# Patient Record
Sex: Male | Born: 1990 | Race: Black or African American | Hispanic: No | Marital: Single | Smoking: Former smoker
Health system: Southern US, Community
[De-identification: ages and names within clinical notes are randomized; demographics above are authoritative.]

## PROBLEM LIST (undated history)

## (undated) DIAGNOSIS — G4700 Insomnia, unspecified: Secondary | ICD-10-CM

## (undated) DIAGNOSIS — F209 Schizophrenia, unspecified: Secondary | ICD-10-CM

---

## 2013-11-19 ENCOUNTER — Emergency Department: Payer: Self-pay | Admitting: Emergency Medicine

## 2013-11-19 LAB — BASIC METABOLIC PANEL
Anion Gap: 6 — ABNORMAL LOW (ref 7–16)
BUN: 11 mg/dL (ref 7–18)
CALCIUM: 8.8 mg/dL (ref 8.5–10.1)
CO2: 27 mmol/L (ref 21–32)
Chloride: 105 mmol/L (ref 98–107)
Creatinine: 1.14 mg/dL (ref 0.60–1.30)
EGFR (African American): >60
EGFR (Non-African Amer.): >60
Glucose: 102 mg/dL — ABNORMAL HIGH (ref 65–99)
OSMOLALITY: 275 (ref 275–301)
Potassium: 3.3 mmol/L — ABNORMAL LOW (ref 3.5–5.1)
SODIUM: 138 mmol/L (ref 136–145)

## 2013-11-19 LAB — DRUG SCREEN, URINE
AMPHETAMINES, UR SCREEN: NEGATIVE (ref ?–1000)
BENZODIAZEPINE, UR SCRN: NEGATIVE (ref ?–200)
Barbiturates, Ur Screen: NEGATIVE (ref ?–200)
COCAINE METABOLITE, UR ~~LOC~~: NEGATIVE (ref ?–300)
Cannabinoid 50 Ng, Ur ~~LOC~~: POSITIVE (ref ?–50)
MDMA (Ecstasy)Ur Screen: NEGATIVE (ref ?–500)
Methadone, Ur Screen: NEGATIVE (ref ?–300)
Opiate, Ur Screen: NEGATIVE (ref ?–300)
Phencyclidine (PCP) Ur S: NEGATIVE (ref ?–25)
TRICYCLIC, UR SCREEN: NEGATIVE (ref ?–1000)

## 2013-11-19 LAB — URINALYSIS, COMPLETE
BACTERIA: NONE SEEN
BILIRUBIN, UR: NEGATIVE
Blood: NEGATIVE
Glucose,UR: NEGATIVE mg/dL (ref 0–75)
Ketone: NEGATIVE
LEUKOCYTE ESTERASE: NEGATIVE
Nitrite: NEGATIVE
Ph: 8 (ref 4.5–8.0)
Protein: NEGATIVE
RBC,UR: 3 /HPF (ref 0–5)
SQUAMOUS EPITHELIAL: NONE SEEN
Specific Gravity: 1.025 (ref 1.003–1.030)
WBC UR: 1 /HPF (ref 0–5)

## 2013-11-19 LAB — PRO B NATRIURETIC PEPTIDE: B-Type Natriuretic Peptide: 16 pg/mL (ref 0–125)

## 2013-11-19 LAB — CBC
HCT: 42.1 % (ref 40.0–52.0)
HGB: 14.3 g/dL (ref 13.0–18.0)
MCH: 31 pg (ref 26.0–34.0)
MCHC: 34.1 g/dL (ref 32.0–36.0)
MCV: 91 fL (ref 80–100)
Platelet: 243 10*3/uL (ref 150–440)
RBC: 4.62 10*6/uL (ref 4.40–5.90)
RDW: 13.1 % (ref 11.5–14.5)
WBC: 7.2 10*3/uL (ref 3.8–10.6)

## 2013-11-19 LAB — TROPONIN I

## 2013-11-19 LAB — MAGNESIUM: MAGNESIUM: 1.3 mg/dL — AB

## 2014-04-30 ENCOUNTER — Emergency Department: Payer: Self-pay | Admitting: Emergency Medicine

## 2014-04-30 LAB — COMPREHENSIVE METABOLIC PANEL WITH GFR
Albumin: 4 g/dL (ref 3.4–5.0)
Alkaline Phosphatase: 54 U/L
Anion Gap: 6 — ABNORMAL LOW (ref 7–16)
BUN: 11 mg/dL (ref 7–18)
Bilirubin,Total: 1.4 mg/dL — ABNORMAL HIGH (ref 0.2–1.0)
Calcium, Total: 8.8 mg/dL (ref 8.5–10.1)
Chloride: 108 mmol/L — ABNORMAL HIGH (ref 98–107)
Co2: 29 mmol/L (ref 21–32)
Creatinine: 1.15 mg/dL (ref 0.60–1.30)
EGFR (African American): >60
EGFR (Non-African Amer.): >60
Glucose: 100 mg/dL — ABNORMAL HIGH (ref 65–99)
Osmolality: 284 (ref 275–301)
Potassium: 3.6 mmol/L (ref 3.5–5.1)
SGOT(AST): 41 U/L — ABNORMAL HIGH (ref 15–37)
SGPT (ALT): 25 U/L (ref 12–78)
Sodium: 143 mmol/L (ref 136–145)
Total Protein: 7.9 g/dL (ref 6.4–8.2)

## 2014-04-30 LAB — DRUG SCREEN, URINE
AMPHETAMINES, UR SCREEN: NEGATIVE (ref ?–1000)
BENZODIAZEPINE, UR SCRN: NEGATIVE (ref ?–200)
Barbiturates, Ur Screen: NEGATIVE (ref ?–200)
CANNABINOID 50 NG, UR ~~LOC~~: POSITIVE (ref ?–50)
COCAINE METABOLITE, UR ~~LOC~~: NEGATIVE (ref ?–300)
MDMA (ECSTASY) UR SCREEN: NEGATIVE (ref ?–500)
Methadone, Ur Screen: NEGATIVE (ref ?–300)
Opiate, Ur Screen: NEGATIVE (ref ?–300)
PHENCYCLIDINE (PCP) UR S: NEGATIVE (ref ?–25)
Tricyclic, Ur Screen: NEGATIVE (ref ?–1000)

## 2014-04-30 LAB — URINALYSIS, COMPLETE
Bacteria: NONE SEEN
Bilirubin,UR: NEGATIVE
Blood: NEGATIVE
Glucose,UR: NEGATIVE mg/dL (ref 0–75)
Ketone: NEGATIVE
Leukocyte Esterase: NEGATIVE
Nitrite: NEGATIVE
Ph: 5 (ref 4.5–8.0)
Protein: 30
RBC,UR: 3 /HPF (ref 0–5)
Specific Gravity: 1.031 (ref 1.003–1.030)
Squamous Epithelial: 1
WBC UR: 2 /HPF (ref 0–5)

## 2014-04-30 LAB — CBC
HCT: 43.9 % (ref 40.0–52.0)
HGB: 14.3 g/dL (ref 13.0–18.0)
MCH: 30.3 pg (ref 26.0–34.0)
MCHC: 32.6 g/dL (ref 32.0–36.0)
MCV: 93 fL (ref 80–100)
Platelet: 243 10*3/uL (ref 150–440)
RBC: 4.73 10*6/uL (ref 4.40–5.90)
RDW: 13.5 % (ref 11.5–14.5)
WBC: 5.1 10*3/uL (ref 3.8–10.6)

## 2014-04-30 LAB — SALICYLATE LEVEL: Salicylates, Serum: <1.7 mg/dL

## 2014-04-30 LAB — ETHANOL

## 2014-04-30 LAB — ACETAMINOPHEN LEVEL: Acetaminophen: <2 ug/mL

## 2015-02-10 NOTE — Consult Note (Signed)
PATIENT NAME:  Ryan Colon, Ryan Colon MR#:  [id number] DATE OF BIRTH:  [birth year]  DATE OF CONSULTATION:  05/01/2014  CONSULTING PHYSICIAN:  Ryah Cribb B. Jennet MaduroPucilowska, MD  REFERRING PHYSICIAN: Dr. Sharyn CreamerMark Quale.   REASON FOR CONSULTATION: To evaluate an agitated patient.   IDENTIFYING DATA: [name] is a 24 year old male with no past psychiatric history.   CHIEF COMPLAINT: "I want to go home."  HISTORY OF PRESENT ILLNESS: Reportedly, the patient was arrested for disorderly behavior. It is impossible to find out what really happened. Apparently his girlfriend stole a gun belonging to his father who is a Insurance risk surveyorbondsman. The patient was arguing about this gun with the girlfriend when police were called. Somehow he was arrested and not the girl who has the gun. The family of the young man is not concerned with the gun or his safety at all. The father bailed him out of jail and immediately took involuntary commitment on him. It does not appear that following release from jail, the patient was aggressive, suicidal, homicidal, or threatening. He called his godmother and related something to her that made her feel that he needs help. He was brought to the Emergency Room and is rather unhappy about it. He denies any psychiatric problems, no depression, anxiety, no psychosis, no substance abuse issues, no symptoms suggestive of bipolar mania. He wishes no help and wishes to take no medications. He seems not to understand why his family felt that he needs to be evaluated. Per family's report, the patient can be agitated and aggressive at times. Initially, in the Emergency Room, he was agitated. Then in the morning he presented really nicely, cool and collected, pleasant, polite, and cooperative. By the time I evaluated him, he was really anxious to leave and even though he denied any problems whatsoever, he promised me that the first thing he is going to do is to beat up his father for bringing him to the hospital. He seems not to  understand that this is not appropriate behavior and when told that it would land him in jail, he seems not to be perturbed as he is well used to being in jail.   PAST PSYCHIATRIC HISTORY: None reported. No suicide attempts. No hospitalizations. No substance abuse problems.   FAMILY PSYCHIATRIC HISTORY: He told me that his grandmother has bipolar 1 disorder.   PAST MEDICAL HISTORY: None.   ALLERGIES: No known drug allergies.   MEDICATIONS ON ADMISSION: None.   SOCIAL HISTORY: He is 22. He lives with his father most of the time. The father is a Insurance risk surveyorbondsman and reportedly, rather argumentative himself. The patient had a job once for 1 month but quit. He did not like that was told what to do and was unhappy to work while he felt everybody else was "sitting on their asses". He believes that he is discriminated against because of his race and says multiple times that he is from the [place] and things are a certain way in the [place]. He wants to figure out another career path but not very specific about that. He did go to college some.   REVIEW OF SYSTEMS:  CONSTITUTIONAL: No fevers or chills. No weight changes.  EYES: No double or blurred vision.  ENT: No hearing loss.  RESPIRATORY: No shortness of breath or cough.  CARDIOVASCULAR: No chest pain or orthopnea.  GASTROINTESTINAL: No abdominal pain, nausea, vomiting, or diarrhea.  GENITOURINARY: No incontinence or frequency.  ENDOCRINE: No heat or cold intolerance.  LYMPHATIC: No anemia or easy  bruising.  INTEGUMENTARY: No acne or rash.  MUSCULOSKELETAL: No muscle or joint pain.  NEUROLOGIC: No tingling or weakness.  PSYCHIATRIC: See history of present illness for details.   PHYSICAL EXAMINATION:  VITAL SIGNS: Blood pressure 102/51, pulse 78, respirations 16, temperature 98.  GENERAL: This is a well-developed, male in no acute distress.   The rest of the physical examination is deferred to his primary attending.   LABORATORY DATA: Chemistries  are within normal limits. Blood alcohol level is zero. LFTs within normal limits, except for total bilirubin of 1.4 and AST of 41. Urine tox screen positive for cannabinoids. CBC within normal limits. Urinalysis is not suggestive of urinary tract infection. Serum acetaminophen and salicylates are low.   MENTAL STATUS EXAMINATION: The patient is alert and oriented to person, place, time, and somewhat to situation even though he does not understand why he was brought to the Emergency Room for psychiatric evaluation on commitment. He is adequately groomed, wearing hospital scrubs. He maintains good eye contact. His speech is loud at times. His mood is fine with irritable affect. Thought process is logical. Thought content: He denies suicidal or homicidal ideations. He denies delusions or paranoia. He denies auditory or visual hallucinations. His cognition seems grossly intact, but the patient is unable to participate in cognitive part of the examination. He seems of normal intelligence and fund of knowledge. His insight and judgment seem poor   DIAGNOSES:  AXIS I: Mood disorder, not otherwise specified. Rule out bipolar disorder.   AXIS II: Deferred.   AXIS III: Deferred.   AXIS IV: Employment, financial, family conflict.   AXIS Colon: Global assessment of functioning 35.   PLAN:  1. The patient is on IVC.  2. I will start him on medications Tegretol and Risperdal for mood stabilization. This is what was requested by the family. The patient feels that he needs no treatment.  3. Unfortunately he needs to stay in the Emergency Room until he stops threatening his father. He thinks that he is going home with the father but this is not what his father tells [place] so disposition may be a problem.    ____________________________ Ellin Goodie Jennet Maduro, MD jbp:lt D: 05/01/2014 17:25:38 ET T: 05/01/2014 21:24:55 ET JOB#: [id number]  cc: Staley Lunz B. Jennet Maduro, MD, <Dictator> Shari Prows  MD ELECTRONICALLY SIGNED 05/11/2014 7:18

## 2015-02-10 NOTE — Consult Note (Signed)
Brief Consult Note: Diagnosis: Mood disorder NOS.   Patient was seen by consultant.   Consult note dictated.   Recommend further assessment or treatment.   Orders entered.   Comments: [name] has no psychiatric past. He was brought to the ER for agitated, disrespectful, threatening behavior. During the interview yesterday, he threatened to "bit up his father" as soon as he is discharged. Today he is pleasant, polite and cooperative. He is cool and collected. He is not suicidal or homicidal. He accepts medications. He is slightly sedated from Risperdal.  PLAN: 1. The patient no longer meets criteria for IVC. I will terminate proceedings. Please discharge as appropriate.   2. Will continue Tegretol and decrease Risperdal to 2 mg at bedtime for mood stabilization. Rx given.  3. Will continue Trazodone as needed for sleep. Rx given.  4. He will follow up with [facility].  5. His father will pick him up at 3:00 pm.  Electronic Signatures: Kristine LineaPucilowska, Sukhraj Esquivias (MD)  (Signed 14-Jul-15 13:10)  Authored: Brief Consult Note   Last Updated: 14-Jul-15 13:10 by Kristine LineaPucilowska, Yanni Ruberg (MD)

## 2015-02-10 NOTE — Consult Note (Signed)
Brief Consult Note: Diagnosis: Mood disorder NOS.   Patient was seen by consultant.   Consult note dictated.   Recommend further assessment or treatment.   Orders entered.   Comments: [name] has no psychiatric past. He was brought to the ER for agitated, disrespectful, threatening behavior. During the interview he threatened to "bit up his father: as soon as he is discharge.  PLAN: 1. The patient is on IVC.  2. Will start Tegretol and Risperdal for mood stabilization and Trazodone for sleep.  Electronic Signatures: Kristine LineaPucilowska, Kazim Corrales (MD)  (Signed 13-Jul-15 14:08)  Authored: Brief Consult Note   Last Updated: 13-Jul-15 14:08 by Kristine LineaPucilowska, Malachy Coleman (MD)

## 2015-02-10 NOTE — Consult Note (Signed)
Brief Consult Note: Diagnosis: MOOD DISRDER NOS.   Patient was seen by consultant.   Orders entered.   Comments: Pt seen in ED BHU. He stated that he came here due to disoderly conduct and was just dischargded from jail. he stated that he wamnts to go [place], where his fa,ily lives and he grew up there. he is responding to the meds. no agitation noted. no si/hi or plans noted.  PLAN  Will change Risperdal 1mg  po bid as he is too sedated.  continue Tegretol.  willl follow.  Electronic Signatures: Rhunette CroftFaheem, Nazario Russom S (MD)  (Signed 14-Jul-15 11:17)  Authored: Brief Consult Note   Last Updated: 14-Jul-15 11:17 by Rhunette CroftFaheem, Mackayla Mullins S (MD)

## 2015-04-01 ENCOUNTER — Emergency Department
Admission: EM | Admit: 2015-04-01 | Discharge: 2015-04-02 | Disposition: A | Discharge status: Other Acute Inpatient Hospital | Payer: MEDICAID | Attending: Emergency Medicine | Admitting: Emergency Medicine

## 2015-04-01 ENCOUNTER — Encounter: Payer: Self-pay | Admitting: Emergency Medicine

## 2015-04-01 DIAGNOSIS — F1210 Cannabis abuse, uncomplicated: Secondary | ICD-10-CM | POA: Diagnosis not present

## 2015-04-01 DIAGNOSIS — F39 Unspecified mood [affective] disorder: Secondary | ICD-10-CM | POA: Insufficient documentation

## 2015-04-01 DIAGNOSIS — R4689 Other symptoms and signs involving appearance and behavior: Secondary | ICD-10-CM

## 2015-04-01 DIAGNOSIS — F250 Schizoaffective disorder, bipolar type: Secondary | ICD-10-CM | POA: Diagnosis not present

## 2015-04-01 DIAGNOSIS — F911 Conduct disorder, childhood-onset type: Secondary | ICD-10-CM | POA: Diagnosis present

## 2015-04-01 DIAGNOSIS — Z720 Tobacco use: Secondary | ICD-10-CM | POA: Diagnosis not present

## 2015-04-01 LAB — URINE DRUG SCREEN, QUALITATIVE (ARMC ONLY)
Amphetamines, Ur Screen: NOT DETECTED
BENZODIAZEPINE, UR SCRN: NOT DETECTED
Barbiturates, Ur Screen: NOT DETECTED
CANNABINOID 50 NG, UR ~~LOC~~: POSITIVE — AB
Cocaine Metabolite,Ur ~~LOC~~: NOT DETECTED
MDMA (Ecstasy)Ur Screen: NOT DETECTED
METHADONE SCREEN, URINE: NOT DETECTED
Opiate, Ur Screen: NOT DETECTED
Phencyclidine (PCP) Ur S: NOT DETECTED
TRICYCLIC, UR SCREEN: NOT DETECTED

## 2015-04-01 LAB — CBC
HEMATOCRIT: 43.2 % (ref 40.0–52.0)
Hemoglobin: 14.4 g/dL (ref 13.0–18.0)
MCH: 30 pg (ref 26.0–34.0)
MCHC: 33.2 g/dL (ref 32.0–36.0)
MCV: 90.4 fL (ref 80.0–100.0)
Platelets: 262 10*3/uL (ref 150–440)
RBC: 4.78 MIL/uL (ref 4.40–5.90)
RDW: 12.6 % (ref 11.5–14.5)
WBC: 5.1 10*3/uL (ref 3.8–10.6)

## 2015-04-01 LAB — COMPREHENSIVE METABOLIC PANEL
ALT: 22 U/L (ref 17–63)
ANION GAP: 7 (ref 5–15)
AST: 53 U/L — AB (ref 15–41)
Albumin: 4.9 g/dL (ref 3.5–5.0)
Alkaline Phosphatase: 54 U/L (ref 38–126)
BUN: 20 mg/dL (ref 6–20)
CO2: 27 mmol/L (ref 22–32)
CREATININE: 1.16 mg/dL (ref 0.61–1.24)
Calcium: 9.5 mg/dL (ref 8.9–10.3)
Chloride: 100 mmol/L — ABNORMAL LOW (ref 101–111)
GFR calc Af Amer: >60 mL/min (ref >60)
GFR calc non Af Amer: >60 mL/min (ref >60)
Glucose, Bld: 84 mg/dL (ref 65–99)
Potassium: 3.8 mmol/L (ref 3.5–5.1)
Sodium: 134 mmol/L — ABNORMAL LOW (ref 135–145)
TOTAL PROTEIN: 8.5 g/dL — AB (ref 6.5–8.1)
Total Bilirubin: 2.4 mg/dL — ABNORMAL HIGH (ref 0.3–1.2)

## 2015-04-01 LAB — SALICYLATE LEVEL: Salicylate Lvl: <4 mg/dL (ref 2.8–30.0)

## 2015-04-01 LAB — ACETAMINOPHEN LEVEL: Acetaminophen (Tylenol), Serum: <10 ug/mL — ABNORMAL LOW (ref 10–30)

## 2015-04-01 LAB — ETHANOL: Alcohol, Ethyl (B): <5 mg/dL (ref <5)

## 2015-04-01 MED ORDER — RISPERIDONE 1 MG PO TABS
1.0000 mg | ORAL_TABLET | ORAL | Status: AC
  Administered 2015-04-01: 1 mg via ORAL

## 2015-04-01 MED ORDER — RISPERIDONE 1 MG PO TABS
ORAL_TABLET | ORAL | Status: AC
  Administered 2015-04-01: 1 mg via ORAL
  Filled 2015-04-01: qty 1

## 2015-04-01 NOTE — ED Notes (Signed)
ED BHU PLACEMENT JUSTIFICATION Is the patient under IVC or is there intent for IVC: Yes.   Is the patient medically cleared: No. Is there vacancy in the ED BHU: Yes.   Is the population mix appropriate for patient: Yes.   Is the patient awaiting placement in inpatient or outpatient setting: No. Has the patient had a psychiatric consult: No. Survey of unit performed for contraband, proper placement and condition of furniture, tampering with fixtures in bathroom, shower, and each patient room: Yes.  ; Findings:  APPEARANCE/BEHAVIOR calm, cooperative and adequate rapport can be established NEURO ASSESSMENT Orientation: time, place and person Hallucinations: No.None noted (Hallucinations) Speech: Normal Gait: normal RESPIRATORY ASSESSMENT Normal expansion.  Clear to auscultation.  No rales, rhonchi, or wheezing. CARDIOVASCULAR ASSESSMENT regular rate and rhythm, S1, S2 normal, no murmur, click, rub or gallop GASTROINTESTINAL ASSESSMENT soft, nontender, BS WNL, no r/g EXTREMITIES normal strength, tone, and muscle mass PLAN OF CARE Provide calm/safe environment. Vital signs assessed twice daily. ED BHU Assessment once each 12-hour shift. Collaborate with intake RN daily or as condition indicates. Assure the ED provider has rounded once each shift. Provide and encourage hygiene. Provide redirection as needed. Assess for escalating behavior; address immediately and inform ED provider.  Assess family dynamic and appropriateness for visitation as needed: No.; If necessary, describe findings: no family present at this time Educate the patient/family about BHU procedures/visitation: Yes.  ; If necessary, describe findings:

## 2015-04-01 NOTE — ED Notes (Signed)
Pt brought into ED BHU via sally port and wand with metal detector for safety by ODS officer. Patient oriented to unit/care area: Pt informed of unit policies and procedures.  Informed that, for their safety, care areas are designed for safety and monitored by security cameras at all times; and visiting hours explained to patient. Patient verbalizes understanding, and verbal contract for safety obtained.Pt shown to their room.  

## 2015-04-01 NOTE — BH Assessment (Signed)
Assessment Note  Ryan Colon is an 24 y.o. male, presents to the ED via the police under IVC; petitioned by the police for c/o per IVC paperwork, "was seen pointing a gun at people in the court square in [place]." Client had a phone pointing it at people. Per client, "I'm homeless; for (2) years; my family is scattered all over the place; I had a argument with my grandmother; "The police is trying to keep me out of the kitchen; I went to [facility] on 03-20-2015"; (constant laughing to himself).  Axis I: Chronic Paranoid Schizophrenia Axis II: Deferred Axis III: History reviewed. No pertinent past medical history. Axis IV: housing problems, other psychosocial or environmental problems, problems related to social environment, problems with access to health care services and problems with primary support group Axis V: 31-40 impairment in reality testing  Past Medical History: History reviewed. No pertinent past medical history.  History reviewed. No pertinent past surgical history.  Family History: No family history on file.  Social History:  reports that he has been smoking Cigarettes.  He has been smoking about 0.50 packs per day. He does not have any smokeless tobacco history on file. He reports that he uses illicit drugs (Marijuana) about 3 times per week. He reports that he does not drink alcohol.  Additional Social History:     CIWA: CIWA-Ar BP: 118/74 mmHg Pulse Rate: 73 COWS:    Allergies: No Known Allergies  Home Medications:  (Not in a hospital admission)  OB/GYN Status:  No LMP for male patient.  General Assessment Data Location of Assessment: [facility] ED TTS Assessment: In system Is this a Tele or Face-to-Face Assessment?: Face-to-Face Is this an Initial Assessment or a Re-assessment for this encounter?: Re-Assessment Marital status: Single Maiden name:  (none) Is patient pregnant?: No Pregnancy Status: No Living Arrangements: Other (Comment) (homeless) Can pt return to  current living arrangement?:  (unknown) Admission Status: Involuntary Is patient capable of signing voluntary admission?: No Referral Source: Other (the police) Insurance type: Self Pay  Medical Screening Exam [facility] Walk-in ONLY) Medical Exam completed: Yes  Crisis Care Plan Living Arrangements: Other (Comment) (homeless) Name of Psychiatrist: none Name of Therapist: none  Education Status Is patient currently in school?: No Current Grade: n/a Highest grade of school patient has completed: 10th Name of school: n/a Contact person: none  Risk to self with the past 6 months Suicidal Ideation: No Has patient been a risk to self within the past 6 months prior to admission? : No Suicidal Intent: No Has patient had any suicidal intent within the past 6 months prior to admission? : No Is patient at risk for suicide?: No Suicidal Plan?: No Has patient had any suicidal plan within the past 6 months prior to admission? : No Access to Means: No What has been your use of drugs/alcohol within the last 12 months?: cannabis; last had yesterday Previous Attempts/Gestures: No How many times?: 0 Other Self Harm Risks: 0 Triggers for Past Attempts: Unknown Intentional Self Injurious Behavior: None Family Suicide History: Unknown Recent stressful life event(s): Conflict (Comment) (and homeless) Persecutory voices/beliefs?: Yes (hearing voices) Depression: No Depression Symptoms:  (denies) Substance abuse history and/or treatment for substance abuse?: Yes Suicide prevention information given to non-admitted patients: Not applicable  Risk to Others within the past 6 months Homicidal Ideation: No Does patient have any lifetime risk of violence toward others beyond the six months prior to admission? : Unknown Thoughts of Harm to Others:  (was seen pointing  a phone like a gun) Current Homicidal Intent: No-Not Currently/Within Last 6 Months Current Homicidal Plan: No-Not Currently/Within Last 6  Months Access to Homicidal Means: No Identified Victim: none History of harm to others?:  (unknown) Assessment of Violence: On admission Violent Behavior Description:  (pointing an object at people on the street; as though it was) Does patient have access to weapons?: No Criminal Charges Pending?: No Does patient have a court date: No Is patient on probation?: No  Psychosis Hallucinations: Auditory Delusions: Unspecified  Mental Status Report Appearance/Hygiene: In scrubs Eye Contact: Poor Motor Activity: Restlessness Speech: Tangential Level of Consciousness:  (laughing to himself) Mood: Anxious Affect: Anxious Anxiety Level: Minimal Thought Processes: Flight of Ideas Judgement: Impaired Orientation: Person, Place Obsessive Compulsive Thoughts/Behaviors:  (unknown)  Cognitive Functioning Concentration: Poor Memory: Recent Impaired IQ:  (unknown) Insight: Poor Impulse Control: Poor Appetite: Fair Weight Loss: 0 Weight Gain: 0 Total Hours of Sleep: 2 ("I don't know.") Vegetative Symptoms: None  ADLScreening [facility] Assessment Services) Patient's cognitive ability adequate to safely complete daily activities?: Yes Patient able to express need for assistance with ADLs?: Yes Independently performs ADLs?: Yes (appropriate for developmental age)  Prior Inpatient Therapy Prior Inpatient Therapy: Yes Prior Therapy Dates: unknown Prior Therapy Facilty/Provider(s): [facility]; [facility]; [facility] Reason for Treatment: schizophrenia  Prior Outpatient Therapy Prior Outpatient Therapy: Yes Prior Therapy Dates: unknown Prior Therapy Facilty/Provider(s): [facility] Reason for Treatment: schizophrenia Does patient have an ACCT team?: No Does patient have Intensive In-House Services?  : No Does patient have [facility] services? : No Does patient have P4CC services?: Unknown  ADL Screening (condition at time of admission) Patient's cognitive ability adequate to safely complete daily activities?:  Yes Patient able to express need for assistance with ADLs?: Yes Independently performs ADLs?: Yes (appropriate for developmental age)       Abuse/Neglect Assessment (Assessment to be complete while patient is alone) Physical Abuse: Denies Verbal Abuse: Denies Sexual Abuse: Denies Exploitation of patient/patient's resources: Denies Self-Neglect: Denies Values / Beliefs Cultural Requests During Hospitalization: None Spiritual Requests During Hospitalization: None Consults Spiritual Care Consult Needed: No Social Work Consult Needed: No Merchant navy officer (For Healthcare) Does patient have an advance directive?: No Would patient like information on creating an advanced directive?: Yes English as a second language teacher given    Additional Information 1:1 In Past 12 Months?: No CIRT Risk: No Elopement Risk: No Does patient have medical clearance?: Yes  Child/Adolescent Assessment Running Away Risk: Denies Bed-Wetting: Denies Destruction of Property: Denies Cruelty to Animals: Denies Stealing: Denies Rebellious/Defies Authority: Denies Satanic Involvement: Denies Archivist: Denies Problems at [employer]: Denies Gang Involvement: Denies  Disposition:  Disposition Initial Assessment Completed for this Encounter: Yes Disposition of Patient: Referred to (psych MD to see) Patient referred to: Other (Comment) (Psych MD)  On Site Evaluation by:   Reviewed with Physician:    Dwan Bolt 04/01/2015 10:42 PM

## 2015-04-01 NOTE — ED Notes (Signed)
ED BHU PLACEMENT JUSTIFICATION  Is the patient under IVC or is there intent for IVC: Yes.  Is the patient medically cleared: Yes.  Is there vacancy in the ED BHU: Yes.  Is the population mix appropriate for patient: Yes.  Is the patient awaiting placement in inpatient or outpatient setting: Yes.  Has the patient had a psychiatric consult: Yes.  Survey of unit performed for contraband, proper placement and condition of furniture, tampering with fixtures in bathroom, shower, and each patient room: Yes. ; Findings: All clear  APPEARANCE/BEHAVIOR  calm, cooperative and adequate rapport can be established  NEURO ASSESSMENT  Orientation: time, place and person  Hallucinations: No.None noted (Hallucinations)  Speech: Normal  Gait: normal  RESPIRATORY ASSESSMENT  WNL  CARDIOVASCULAR ASSESSMENT  WNL  GASTROINTESTINAL ASSESSMENT  WNL  EXTREMITIES  WNL  PLAN OF CARE  Provide calm/safe environment. Vital signs assessed twice daily. ED BHU Assessment once each 12-hour shift. Collaborate with intake RN daily or as condition indicates. Assure the ED provider has rounded once each shift. Provide and encourage hygiene. Provide redirection as needed. Assess for escalating behavior; address immediately and inform ED provider.  Assess family dynamic and appropriateness for visitation as needed: Yes. ; If necessary, describe findings:  Educate the patient/family about BHU procedures/visitation: Yes. ; If necessary, describe findings: Pt is calm and cooperative at this time. Pt understanding and accepting of unit procedures/rules. Will continue to monitor.  

## 2015-04-01 NOTE — ED Notes (Signed)

## 2015-04-01 NOTE — ED Notes (Signed)

## 2015-04-01 NOTE — ED Notes (Signed)
Patient assigned to appropriate care area. Patient oriented to unit/care area: Informed that, for their safety, care areas are designed for safety and monitored by security cameras at all times; and visiting hours explained to patient. Patient verbalizes understanding, and verbal contract for safety obtained. 

## 2015-04-01 NOTE — ED Notes (Signed)
BEHAVIORAL HEALTH ROUNDING Patient sleeping: No. Patient alert and oriented: yes Behavior appropriate: Yes.  ; If no, describe:  Nutrition and fluids offered: Yes  Toileting and hygiene offered: Yes  Sitter present: no Law enforcement present: Yes  

## 2015-04-01 NOTE — ED Provider Notes (Signed)
[facility] Emergency Department Provider Note  ____________________________________________  Time seen: Approximately 9:13 PM  I have reviewed the triage vital signs and the nursing notes.   HISTORY  Chief Complaint Aggressive Behavior    HPI Ryan Colon is a 24 y.o. male who was walking around ground, when he states before occasionally gesturing at him, so he was pretending to point his cell phone at them like a gun. This prompted a call to [place] police.  Patient denies suicidal or homicidal intent. States he is just "gesturing back". He is homeless, though he states his family lives in [place]. He denies being ill. States he sometimes get nauseated if he eats a large meal, and he has been followed at [facility] for psychiatric disease.  He is in no pain. No distress.   History reviewed. No pertinent past medical history.  There are no active problems to display for this patient.   History reviewed. No pertinent past surgical history.  No current outpatient prescriptions on file.  Allergies Review of patient's allergies indicates no known allergies.  No family history on file.  Social History History  Substance Use Topics  . Smoking status: Current Every Day Smoker -- 0.50 packs/day    Types: Cigarettes  . Smokeless tobacco: Not on file  . Alcohol Use: No    Review of Systems Constitutional: No fever/chills Eyes: No visual changes. ENT: No sore throat. Cardiovascular: Denies chest pain. Respiratory: Denies shortness of breath. Gastrointestinal: No abdominal pain.  No nausea, no vomiting.  No diarrhea.  No constipation. Genitourinary: Negative for dysuria. Musculoskeletal: Negative for back pain. Skin: Negative for rash. Neurological: Negative for headaches, focal weakness or numbness.  Denies wishing to harm himself or others.  10-point ROS otherwise negative.  ____________________________________________   PHYSICAL EXAM:  VITAL  SIGNS: ED Triage Vitals  Enc Vitals Group     BP 04/01/15 2033 118/74 mmHg     Pulse Rate 04/01/15 2033 73     Resp 04/01/15 2033 18     Temp 04/01/15 2033 98.3 F (36.8 C)     Temp Source 04/01/15 2033 Oral     SpO2 04/01/15 2033 96 %     Weight 04/01/15 2033 191 lb (86.637 kg)     Height 04/01/15 2033  (1.727 m)     Head Cir --      Peak Flow --      Pain Score 04/01/15 2111 0     Pain Loc --      Pain Edu? --      Excl. in GC? --     Constitutional: Alert and oriented. Well appearing and in no acute distress. He is presently eaten about three quarters of a sandwich. He is calm and appropriate. Eyes: Conjunctivae are normal. PERRL. EOMI. Head: Atraumatic. Nose: No congestion/rhinnorhea. Mouth/Throat: Mucous membranes are moist.  Oropharynx non-erythematous. Neck: No stridor.   Cardiovascular: Normal rate, regular rhythm. Grossly normal heart sounds.  Good peripheral circulation. Respiratory: Normal respiratory effort.  No retractions. Lungs CTAB. Gastrointestinal: Soft and nontender. No distention. No abdominal bruits. No CVA tenderness. Musculoskeletal: No lower extremity tenderness nor edema.  No joint effusions. Neurologic:  Normal speech and language. No gross focal neurologic deficits are appreciated. Speech is normal. No gait instability. Skin:  Skin is warm, dry and intact. No rash noted. Psychiatric: Mood and affect are him what flat.   ____________________________________________   LABS (all labs ordered are listed, but only abnormal results are displayed)  Labs Reviewed  ACETAMINOPHEN LEVEL - Abnormal; Notable for the following:    Acetaminophen (Tylenol), Serum <10 (*)    All other components within normal limits  COMPREHENSIVE METABOLIC PANEL - Abnormal; Notable for the following:    Sodium 134 (*)    Chloride 100 (*)    Total Protein 8.5 (*)    AST 53 (*)    Total Bilirubin 2.4 (*)    All other components within normal limits  URINE DRUG SCREEN,  QUALITATIVE ([facility] ONLY) - Abnormal; Notable for the following:    Cannabinoid 50 Ng, Ur [place] POSITIVE (*)    All other components within normal limits  CBC  ETHANOL  SALICYLATE LEVEL   ____________________________________________  EKG   ____________________________________________  RADIOLOGY   ____________________________________________   PROCEDURES  Procedure(s) performed: None  Critical Care performed: No  ____________________________________________   INITIAL IMPRESSION / ASSESSMENT AND PLAN / ED COURSE  Pertinent labs & imaging results that were available during my care of the patient were reviewed by me and considered in my medical decision making (see chart for details).  Patient presents for evaluation of aggressive behavior. He denies when a harm himself or others. He is under IVC and does have a previous history of a mood disorder and is currently not on his medications.  Screening labs indicated elevated bilirubin, but given the patient has no pain and is presently eating well and do not feel that ongoing emergent workup is necessitated.  ----------------------------------------- 10:05 PM on 04/01/2015 -----------------------------------------  Ccleared medically at this time. We'll refer him to psychiatry for further consultation in the ER. ____________________________________________   FINAL CLINICAL IMPRESSION(S) / ED DIAGNOSES  Final diagnoses:  Mood disorder  Aggressive behavior      Sharyn Creamer, MD 04/01/15 2206

## 2015-04-01 NOTE — ED Notes (Signed)
Pt brought in by [place] police, pt states that he hasn't been sleeping well, ivc papers with pt, pt states that he was prescribed meds from [facility] but has misplaced them, pt was holding his cell phone up pointing it at people as if it were a gun on multiple occasions today and yesterday. Pt denies drinking or drugs other than marijuana, states last time was yesterday

## 2015-04-02 ENCOUNTER — Inpatient Hospital Stay
Admission: EM | Admit: 2015-04-02 | Discharge: 2015-04-18 | DRG: 885 | Disposition: A | Discharge status: Home / Self Care | Payer: No Typology Code available for payment source | Source: Ambulatory Visit | Attending: Psychiatry | Admitting: Psychiatry

## 2015-04-02 ENCOUNTER — Encounter: Payer: Self-pay | Admitting: *Deleted

## 2015-04-02 ENCOUNTER — Inpatient Hospital Stay: Payer: No Typology Code available for payment source

## 2015-04-02 ENCOUNTER — Encounter: Payer: Self-pay | Admitting: Psychiatry

## 2015-04-02 DIAGNOSIS — F203 Undifferentiated schizophrenia: Secondary | ICD-10-CM | POA: Diagnosis not present

## 2015-04-02 DIAGNOSIS — Z9119 Patient's noncompliance with other medical treatment and regimen: Secondary | ICD-10-CM | POA: Diagnosis present

## 2015-04-02 DIAGNOSIS — G4700 Insomnia, unspecified: Secondary | ICD-10-CM | POA: Diagnosis present

## 2015-04-02 DIAGNOSIS — Z590 Homelessness: Secondary | ICD-10-CM | POA: Diagnosis not present

## 2015-04-02 DIAGNOSIS — F1220 Cannabis dependence, uncomplicated: Secondary | ICD-10-CM | POA: Diagnosis present

## 2015-04-02 DIAGNOSIS — F250 Schizoaffective disorder, bipolar type: Secondary | ICD-10-CM | POA: Diagnosis not present

## 2015-04-02 DIAGNOSIS — F12959 Cannabis use, unspecified with psychotic disorder, unspecified: Secondary | ICD-10-CM | POA: Diagnosis not present

## 2015-04-02 DIAGNOSIS — Z560 Unemployment, unspecified: Secondary | ICD-10-CM | POA: Diagnosis present

## 2015-04-02 DIAGNOSIS — F319 Bipolar disorder, unspecified: Secondary | ICD-10-CM | POA: Diagnosis present

## 2015-04-02 DIAGNOSIS — F29 Unspecified psychosis not due to a substance or known physiological condition: Secondary | ICD-10-CM

## 2015-04-02 DIAGNOSIS — F209 Schizophrenia, unspecified: Principal | ICD-10-CM | POA: Diagnosis present

## 2015-04-02 DIAGNOSIS — F17200 Nicotine dependence, unspecified, uncomplicated: Secondary | ICD-10-CM | POA: Diagnosis present

## 2015-04-02 DIAGNOSIS — F17210 Nicotine dependence, cigarettes, uncomplicated: Secondary | ICD-10-CM | POA: Diagnosis present

## 2015-04-02 LAB — LIPID PANEL
Cholesterol: 125 mg/dL (ref 0–200)
HDL: 71 mg/dL (ref >40)
LDL Cholesterol: 45 mg/dL (ref 0–99)
TRIGLYCERIDES: 44 mg/dL (ref <150)
Total CHOL/HDL Ratio: 1.8 RATIO
VLDL: 9 mg/dL (ref 0–40)

## 2015-04-02 LAB — TSH: TSH: 1.173 u[IU]/mL (ref 0.350–4.500)

## 2015-04-02 LAB — AMMONIA: AMMONIA: 19 umol/L (ref 9–35)

## 2015-04-02 MED ORDER — RISPERIDONE 0.5 MG PO TABS
0.5000 mg | ORAL_TABLET | Freq: Two times a day (BID) | ORAL | Status: DC
  Administered 2015-04-02 – 2015-04-03 (×2): 0.5 mg via ORAL
  Filled 2015-04-02 (×2): qty 1

## 2015-04-02 MED ORDER — MAGNESIUM HYDROXIDE 400 MG/5ML PO SUSP: 30.0000 mL | Freq: Every day | ORAL | Status: DC | PRN

## 2015-04-02 MED ORDER — ALUM & MAG HYDROXIDE-SIMETH 200-200-20 MG/5ML PO SUSP: 30.0000 mL | ORAL | Status: DC | PRN

## 2015-04-02 MED ORDER — ACETAMINOPHEN 325 MG PO TABS
650.0000 mg | ORAL_TABLET | Freq: Four times a day (QID) | ORAL | Status: DC | PRN
  Administered 2015-04-05 – 2015-04-14 (×2): 650 mg via ORAL
  Filled 2015-04-02 (×2): qty 2

## 2015-04-02 MED ORDER — TRAZODONE HCL 50 MG PO TABS
50.0000 mg | ORAL_TABLET | Freq: Every day | ORAL | Status: DC
  Administered 2015-04-02: 50 mg via ORAL
  Filled 2015-04-02: qty 1

## 2015-04-02 MED ORDER — LORAZEPAM 2 MG PO TABS
2.0000 mg | ORAL_TABLET | Freq: Four times a day (QID) | ORAL | Status: DC | PRN
  Administered 2015-04-08 – 2015-04-09 (×2): 2 mg via ORAL
  Filled 2015-04-02 (×2): qty 1

## 2015-04-02 NOTE — ED Notes (Signed)
BEHAVIORAL HEALTH ROUNDING Patient sleeping: Yes.   Patient alert and oriented: not applicable SLEEPING Behavior appropriate: Yes.  ; If no, describe: SLEEPING Nutrition and fluids offered: No SLEEPING Toileting and hygiene offered: NoSLEEPING Sitter present: not applicable Law enforcement present: Yes ODS 

## 2015-04-02 NOTE — Consult Note (Signed)
[facility] Psychiatry Consult   Reason for Consult:  Pointing guns at people in the court house Referring Physician:  Dr. Jacqualine Code Patient Identification: Ryan Colon MRN:  [id number] Principal Diagnosis: <principal problem not specified> Diagnosis:   Patient Active Problem List   Diagnosis Date Noted  . Schizoaffective disorder, bipolar type [F25.0] 04/02/2015    Total Time spent with patient: 1 hour  Subjective:   Ryan Colon is a 24 y.o. male patient admitted with IVC as she presented by the police petitioned by the police for pointing guns at the people in the courthouse in [place].  HPI:   Initial history was obtained from the patient's chart as well as from the records. According to the initial records patient had a phone which she was pointing it at the people in the courthouse in [place]. He reported that he has been homeless for the past 2 years and his family is scattered all over the place. He reported that he had a argument with his grandmother and he went to see his father and then  lost control. During my interview patient appears somewhat sedated and reported that he has been working with his father briefly as he is up [place] and works in [place] but then he got bored and left there quickly. He reported that he was trying to shoot at people and was disturbing the traffic as he was walking in the middle of the road. Patient reported that he did not own a gun but was using his telephone to trying to distract the traffic. Patient reported that he does not have any auditory hallucinations. He currently denied having any suicidal ideations or plans but was minimizing the event led to his admission. He stated that he does not take any medications at this time.  HPI Elements:   Location:  noted.  Past Medical History: History reviewed. No pertinent past medical history. History reviewed. No pertinent past surgical history. Family History: History reviewed. No pertinent  family history. Social History:  History  Alcohol Use No     History  Drug Use  . 3.00 per week  . Special: Marijuana    History   Social History  . Marital Status: Single    Spouse Name: N/A  . Number of Children: N/A  . Years of Education: N/A   Social History Main Topics  . Smoking status: Current Every Day Smoker -- 0.50 packs/day    Types: Cigarettes  . Smokeless tobacco: Not on file  . Alcohol Use: No  . Drug Use: 3.00 per week    Special: Marijuana  . Sexual Activity: Not on file   Other Topics Concern  . None   Social History Narrative   Additional Social History:    patient currently lives with his father. His father is up [place] and argue with each other most of the time. Patient reported that he completed high school in 2011 and went to [place] to join the [employer] for couple of months but was playing football over there so he left and came back. He reported that he has been homeless since then. He reported that he wanted to go to the [employer].  Patient has previous history of admission in 2015 when he presented due to disorderly behavior and was arrested. At that time he stole his girlfriends gun and was arrested his father bailed him out of the jail and took an  IVC on him. Patient reported that he has never attempted  suicide and does not have any history of previous psychiatric hospitalization. He was previously prescribed Tegretol and Risperdal but he has history of noncompliance with medications.                        Allergies:  No Known Allergies  Labs:  Results for orders placed or performed during the hospital encounter of 04/01/15 (from the past 48 hour(s))  Acetaminophen level     Status: Abnormal   Collection Time: 04/01/15  8:38 PM  Result Value Ref Range   Acetaminophen (Tylenol), Serum <10 (L) 10 - 30 ug/mL    Comment:        THERAPEUTIC CONCENTRATIONS VARY SIGNIFICANTLY. A RANGE OF 10-30 ug/mL MAY BE AN  EFFECTIVE CONCENTRATION FOR MANY PATIENTS. HOWEVER, SOME ARE BEST TREATED AT CONCENTRATIONS OUTSIDE THIS RANGE. ACETAMINOPHEN CONCENTRATIONS >150 ug/mL AT 4 HOURS AFTER INGESTION AND >50 ug/mL AT 12 HOURS AFTER INGESTION ARE OFTEN ASSOCIATED WITH TOXIC REACTIONS.   CBC     Status: None   Collection Time: 04/01/15  8:38 PM  Result Value Ref Range   WBC 5.1 3.8 - 10.6 K/uL   RBC 4.78 4.40 - 5.90 MIL/uL   Hemoglobin 14.4 13.0 - 18.0 g/dL   HCT 43.2 40.0 - 52.0 %   MCV 90.4 80.0 - 100.0 fL   MCH 30.0 26.0 - 34.0 pg   MCHC 33.2 32.0 - 36.0 g/dL   RDW 12.6 11.5 - 14.5 %   Platelets 262 150 - 440 K/uL  Comprehensive metabolic panel     Status: Abnormal   Collection Time: 04/01/15  8:38 PM  Result Value Ref Range   Sodium 134 (L) 135 - 145 mmol/L   Potassium 3.8 3.5 - 5.1 mmol/L   Chloride 100 (L) 101 - 111 mmol/L   CO2 27 22 - 32 mmol/L   Glucose, Bld 84 65 - 99 mg/dL   BUN 20 6 - 20 mg/dL   Creatinine, Ser 1.16 0.61 - 1.24 mg/dL   Calcium 9.5 8.9 - 10.3 mg/dL   Total Protein 8.5 (H) 6.5 - 8.1 g/dL   Albumin 4.9 3.5 - 5.0 g/dL   AST 53 (H) 15 - 41 U/L   ALT 22 17 - 63 U/L   Alkaline Phosphatase 54 38 - 126 U/L   Total Bilirubin 2.4 (H) 0.3 - 1.2 mg/dL   GFR calc non Af Amer >60 >60 mL/min   GFR calc Af Amer >60 >60 mL/min    Comment: (NOTE) The eGFR has been calculated using the CKD EPI equation. This calculation has not been validated in all clinical situations. eGFR's persistently <60 mL/min signify possible Chronic Kidney Disease.    Anion gap 7 5 - 15  Ethanol (ETOH)     Status: None   Collection Time: 04/01/15  8:38 PM  Result Value Ref Range   Alcohol, Ethyl (B) <5 <5 mg/dL    Comment:        LOWEST DETECTABLE LIMIT FOR SERUM ALCOHOL IS 5 mg/dL FOR MEDICAL PURPOSES ONLY   Salicylate level     Status: None   Collection Time: 04/01/15  8:38 PM  Result Value Ref Range   Salicylate Lvl <8.3 2.8 - 30.0 mg/dL  Urine Drug Screen, Qualitative ([facility] only)      Status: Abnormal   Collection Time: 04/01/15  8:38 PM  Result Value Ref Range   Tricyclic, Ur Screen NONE DETECTED NONE DETECTED   Amphetamines, Ur Screen NONE DETECTED NONE  DETECTED   MDMA (Ecstasy)Ur Screen NONE DETECTED NONE DETECTED   Cocaine Metabolite,Ur [place] NONE DETECTED NONE DETECTED   Opiate, Ur Screen NONE DETECTED NONE DETECTED   Phencyclidine (PCP) Ur S NONE DETECTED NONE DETECTED   Cannabinoid 50 Ng, Ur [place] POSITIVE (A) NONE DETECTED   Barbiturates, Ur Screen NONE DETECTED NONE DETECTED   Benzodiazepine, Ur Scrn NONE DETECTED NONE DETECTED   Methadone Scn, Ur NONE DETECTED NONE DETECTED    Comment: (NOTE) 027  Tricyclics, urine               Cutoff 1000 ng/mL 200  Amphetamines, urine             Cutoff 1000 ng/mL 300  MDMA (Ecstasy), urine           Cutoff 500 ng/mL 400  Cocaine Metabolite, urine       Cutoff 300 ng/mL 500  Opiate, urine                   Cutoff 300 ng/mL 600  Phencyclidine (PCP), urine      Cutoff 25 ng/mL 700  Cannabinoid, urine              Cutoff 50 ng/mL 800  Barbiturates, urine             Cutoff 200 ng/mL 900  Benzodiazepine, urine           Cutoff 200 ng/mL 1000 Methadone, urine                Cutoff 300 ng/mL 1100 1200 The urine drug screen provides only a preliminary, unconfirmed 1300 analytical test result and should not be used for non-medical 1400 purposes. Clinical consideration and professional judgment should 1500 be applied to any positive drug screen result due to possible 1600 interfering substances. A more specific alternate chemical method 1700 must be used in order to obtain a confirmed analytical result.  1800 Gas chromato graphy / mass spectrometry (GC/MS) is the preferred 1900 confirmatory method.     Vitals: Blood pressure 107/54, pulse 65, temperature 97.7 F (36.5 C), temperature source Oral, resp. rate 16, height 5' 8"  (1.727 m), weight 86.637 kg (191 lb), SpO2 99 %.  Risk to Self: Suicidal Ideation: No Suicidal Intent:  No Is patient at risk for suicide?: No Suicidal Plan?: No Access to Means: No What has been your use of drugs/alcohol within the last 12 months?: cannabis; last had yesterday How many times?: 0 Other Self Harm Risks: 0 Triggers for Past Attempts: Unknown Intentional Self Injurious Behavior: None Risk to Others: Homicidal Ideation: No Thoughts of Harm to Others:  (was seen pointing a phone like a gun) Current Homicidal Intent: No-Not Currently/Within Last 6 Months Current Homicidal Plan: No-Not Currently/Within Last 6 Months Access to Homicidal Means: No Identified Victim: none History of harm to others?:  (unknown) Assessment of Violence: On admission Violent Behavior Description:  (pointing an object at people on the street; as though it was) Does patient have access to weapons?: No Criminal Charges Pending?: No Does patient have a court date: No Prior Inpatient Therapy: Prior Inpatient Therapy: Yes Prior Therapy Dates: unknown Prior Therapy Facilty/Provider(s): [facility]; [place]; [place] Reason for Treatment: schizophrenia Prior Outpatient Therapy: Prior Outpatient Therapy: Yes Prior Therapy Dates: unknown Prior Therapy Facilty/Provider(s): [facility] Reason for Treatment: schizophrenia Does patient have an ACCT team?: No Does patient have Intensive In-House Services?  : No Does patient have [facility] services? : No Does patient have P4CC services?: Unknown  No current facility-administered medications for this encounter.   No current outpatient prescriptions on file.    Musculoskeletal: Strength & Muscle Tone: within normal limits Gait & Station: did not test Patient leans: N/A  Psychiatric Specialty Exam: Physical Exam  Review of Systems  Constitutional: Positive for malaise/fatigue.  HENT: Negative for congestion and tinnitus.   Eyes: Negative for double vision and discharge.  Cardiovascular: Negative for claudication.  Gastrointestinal: Negative for vomiting and blood in stool.   Neurological: Negative for tremors and speech change.  Endo/Heme/Allergies: Negative for environmental allergies.  Psychiatric/Behavioral: Positive for depression and hallucinations. The patient is nervous/anxious. The patient does not have insomnia.     Blood pressure 107/54, pulse 65, temperature 97.7 F (36.5 C), temperature source Oral, resp. rate 16, height 5' 8"  (1.727 m), weight 86.637 kg (191 lb), SpO2 99 %.Body mass index is 29.05 kg/(m^2).  General Appearance: Disheveled  Eye Contact::  Minimal  Speech:  Slurred  Volume:  Decreased  Mood:  Depressed and Dysphoric  Affect:  Blunt  Thought Process:  Disorganized  Orientation:  Other:  sleepy  Thought Content:  WDL  Suicidal Thoughts:  No  Homicidal Thoughts:  No  Memory:  did not participate   Judgement:  Poor  Insight:  Shallow  Psychomotor Activity:  Decreased  Concentration:  Poor  Recall:  Asher not test  Language: Fair  Akathisia:  No  Handed:  Right  AIMS (if indicated):     Assets:  Armed forces logistics/support/administrative officer Physical Health Social Support  ADL's:  Intact  Cognition: WNL  Sleep:      Medical Decision Making: Review or order clinical lab tests (1), Review and summation of old records (2) and Established Problem, Worsening (2)  Treatment Plan Summary: Plan Pt to be admitted to [facility] inpt Unit.  Plan:  Recommend psychiatric Inpatient admission when medically cleared. Disposition:   Patient will be admitted to the inpatient behavioral health unit He will be started on Risperdal 0.5 mg by mouth daily at bedtime He will  also be started on trazodone 100 mg by mouth daily at bedtime when necessary for insomnia I reviewed his old records and his consult notes from last admission and he stabilized on a combination of Risperdal and Tegretol Treatment team to obtain  collateral information from his family members Thank you for allowing me to participate in the care of this patient  This note was  generated in part or whole with voice recognition software. Voice regonition is usually quite accurate but there are transcription errors that can and very often do occur. I apologize for any typographical errors that were not detected and corrected.   Rainey Pines 04/02/2015 10:37 AM

## 2015-04-02 NOTE — Progress Notes (Signed)
Patient alert and oriented x 4, cooperative with admission assessment and interview. Skin check and belongings check completed with no contraband found. Denies SI/HI/AVH at this time. MD orders CAT scan and patient escorted for scan with 2 staff and security and returns to unit safely. Patient monitored for s/s of psychosis. MD into assess.  Labs ordered and drawn with results pending. Safety maintained.

## 2015-04-02 NOTE — [facility] Suicide Risk Assessment (Signed)
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 Cannabis use disorder, severe, dependence [F12.20] 04/02/2015  . Tobacco use disorder [Z72.0] 04/02/2015  . 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  World Science writer [facility]). Score between 0-7:  no or low risk or alcohol related problems. Score between 8-15:  moderate risk of alcohol related problems. Score between 16-19:  high risk of alcohol related problems. Score 20 or above:  warrants further diagnostic evaluation for alcohol dependence and treatment.   CLINICAL FACTORS:   Alcohol/Substance Abuse/Dependencies Currently Psychotic Previous Psychiatric Diagnoses and Treatments   Musculoskeletal: Strength & Muscle Tone: within normal limits Gait & Station: normal Patient leans: N/A  Psychiatric Specialty Exam: Physical Exam  ROS  Blood pressure 117/80, pulse 59, temperature 97 F (36.1 C), temperature source Axillary, resp. rate 16, height 5\' 8"  (1.727 m), weight 80.74 kg (178 lb).Body mass index is 27.07 kg/(m^2).                                                         COGNITIVE FEATURES THAT CONTRIBUTE TO RISK:  Loss of executive function    SUICIDE RISK:   Moderate:  Frequent suicidal ideation with limited intensity, and duration, some specificity in terms of plans, no associated intent, good  self-control, limited dysphoria/symptomatology, some risk factors present, and identifiable protective factors, including available and accessible social support.  PLAN OF CARE: Admit to behavioral health   Medical Decision Making:  New problem, with additional work up planned  I certify that inpatient services furnished can reasonably be expected to improve the patient's condition.   Jimmy Footman 04/02/2015, 5:05 PM

## 2015-04-02 NOTE — H&P (Signed)
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 Cannabis use disorder, severe, dependence [F12.20] 04/02/2015  . Tobacco use disorder [Z72.0] 04/02/2015  . 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 male who was brought him by [place] police under involuntary commitment.  The paperwork is states that the patient was prescribed medications at [facility] but has misplaced them.  Police was contacted as patient was pretending to point his cell phone at people as if it was a gun outside [employer].   I attempted to interview the patient today but his thought process was vague and at times disorganized. His his statements were very difficult to follow. Patient reported that he was walking around [place] and was pointing at people with his phone as a joke. He denies having any auditory or visual hallucinations prior to admission. He denies having any persecutory ideas. He denied having major problems with mood, appetite, energy or concentration. He does report having difficulties with sleep prior to admission; he says he was waking up multiple times in the middle of the night. The patient denies any suicidality, or homicidality. During the assessment he reported that his major concern is trying to find a way to support himself without having to have violent thoughts. I asked the patient to elaborate that he was unable. He denied to me having any violent thoughts at this time. Patient reports that his major support is his father and her godmother.   Bipolar disorder: At this time there is no evidence of mania or  hypomania.  Substance abuse history: Urine toxicology since 2015 has been consistently positive for cannabis. No other substances have been detected in the urine toxicology. Prior to this current admission his alcohol level was below the detection limit.  Patient smokes half a pack a day.  Patient states he smokes marijuana about 3 times a week as he does not have enough money to live more often. He usually smokes blunts. He states that he has experimented with Molly's in the past but denies the use of any other illicit substance. As far as alcohol he states he only drinks rarely and denies any blackouts or withdrawal symptoms.   Elements:  Severity:  sevre. Timing:  chronic with acute exacerbation. Duration:  last 2 days. Context:  non compliance, cannabis use.  Total Time spent with patient: 1 hour  Past psychiatric history:Patient reported that he has never attempted suicide and does not have any history of previous psychiatric hospitalization.  Patient was in our emergency department on July 2015.  He was not admitted to the unit, per records looks like he was in the emergency department for at least 24 hours: "Mr. Burdi has no psychiatric past. He was brought to the ER for agitated, disrespectful, threatening behavior. During the interview yesterday, he threatened to "bit up his father" as soon as he is discharged." During that ER visit he was diagnosed with mood disorder NOS and was started on Risperdal and Tegretol. Per review of records there were no manic or hypomanic symptoms, also there was no evidence of psychosis. Appears that the main issue was behavioral.  Patient was also in our emergency department in January 2015  there's no notes from that encounter. Appears that the reason for arrival was chest pain.  Past Medical History: History reviewed. No pertinent past medical history. History reviewed. No pertinent past surgical history. denies any history of chronic medical conditions,  seizures, head trauma or surgeries.  Family History: History reviewed. No pertinent family history. denies any history of a mental illness in his family, suicide or substance abuse  Social History: Single never married doesn't have any children. He is staying on and off with relatives in [place] and [place].  Up until recently he was living with his father in [place] but is states that his father kicked him out because he broke the TV. Patient states he's been unemployed for one month, prior to that he was working detailing cars in [place] but due to transportation issues he lost his job.  As far as his education the patient reported graduating from 12 grade. He repeated his senior year. He reported being numb fair student, he was actively involved in sports and played football in high school. As far as his legal history he states he has been in and out of jail or was vague about his charges. He states that he has had domestic arguments, and was recently on probation for disorderly conduct. The patient stated that he was placed on unsupervised probation and he successfully clamped. His community service. However he violated his probation terms as he did not pay the fines "I think is going to come back to hunt me". History  Alcohol Use  . 10.8 oz/week  . 18 Cans of beer per week     History  Drug Use  . 3.00 per week  . Special: Marijuana    History   Social History  . Marital Status: Single    Spouse Name: N/A  . Number of Children: N/A  . Years of Education: N/A   Social History Main Topics  . Smoking status: Current Every Day Smoker -- 0.50 packs/day for 4 years    Types: Cigarettes  . Smokeless tobacco: Not on file  . Alcohol Use: 10.8 oz/week    18 Cans of beer per week  . Drug Use: 3.00 per week    Special: Marijuana  . Sexual Activity: Yes   Other Topics Concern  . None   Social History Narrative   Musculoskeletal: Strength & Muscle Tone: within normal limits Gait &  Station: normal Patient leans: N/A  Psychiatric Specialty Exam: Physical Exam  Review of Systems  Constitutional: Negative.   HENT: Negative.   Eyes: Negative.   Respiratory: Negative.   Cardiovascular: Negative.   Gastrointestinal: Negative.   Genitourinary: Negative.   Musculoskeletal: Negative.   Skin: Negative.   Neurological: Negative.   Endo/Heme/Allergies: Negative.   Psychiatric/Behavioral: Positive for substance abuse.    Blood pressure 117/80, pulse 59, temperature 97 F (36.1 C), temperature source Axillary, resp. rate 16, height 5' 8"  (1.727 m), weight 80.74 kg (178 lb).Body mass index is 27.07 kg/(m^2).  General Appearance: Fairly Groomed  Engineer, water::  Fair  Speech:  Normal Rate  Volume:  Normal  Mood:  Euthymic  Affect:  Congruent  Thought Process:  Tangential  Orientation:  Full (Time, Place, and Person)  Thought Content:  Hallucinations: None  Suicidal Thoughts:  No  Homicidal Thoughts:  No  Memory:  Immediate;   Fair Recent;   Fair Remote;   Good  Judgement:  Impaired  Insight:  Lacking  Psychomotor Activity:  Decreased  Concentration:  Fair  Recall:  NA  Fund of Knowledge:Fair  Language: Fair  Akathisia:  No  Handed:    AIMS (if indicated):     Assets:  Physical Health Vocational/Educational  ADL's:  Intact  Cognition: WNL  Sleep:      PHYSICAL EXAM:  Constitutional: Alert and oriented. Well appearing and in no acute distress. He is presently eaten about three quarters of a sandwich. He is calm and appropriate. Eyes: Conjunctivae are normal. PERRL. EOMI. Head: Atraumatic. Nose: No congestion/rhinnorhea. Mouth/Throat: Mucous membranes are moist.  Oropharynx non-erythematous. Neck: No stridor.    Cardiovascular: Normal rate, regular rhythm. Grossly normal heart sounds.  Good peripheral circulation. Respiratory: Normal respiratory effort.  No retractions. Lungs CTAB. Gastrointestinal: Soft and nontender. No distention. No abdominal bruits.  No CVA tenderness. Musculoskeletal: No lower extremity tenderness nor edema.  No joint effusions. Neurologic:  Normal speech and language. No gross focal neurologic deficits are appreciated. Speech is normal. No gait instability. Skin:  Skin is warm, dry and intact. No rash noted. Psychiatric: Mood and affect are him what flat.    Allergies:  No Known Allergies   Lab Results:  Results for orders placed or performed during the hospital encounter of 04/01/15 (from the past 48 hour(s))  Acetaminophen level     Status: Abnormal   Collection Time: 04/01/15  8:38 PM  Result Value Ref Range   Acetaminophen (Tylenol), Serum <10 (L) 10 - 30 ug/mL    Comment:        THERAPEUTIC CONCENTRATIONS VARY SIGNIFICANTLY. A RANGE OF 10-30 ug/mL MAY BE AN EFFECTIVE CONCENTRATION FOR MANY PATIENTS. HOWEVER, SOME ARE BEST TREATED AT CONCENTRATIONS OUTSIDE THIS RANGE. ACETAMINOPHEN CONCENTRATIONS >150 ug/mL AT 4 HOURS AFTER INGESTION AND >50 ug/mL AT 12 HOURS AFTER INGESTION ARE OFTEN ASSOCIATED WITH TOXIC REACTIONS.   CBC     Status: None   Collection Time: 04/01/15  8:38 PM  Result Value Ref Range   WBC 5.1 3.8 - 10.6 K/uL   RBC 4.78 4.40 - 5.90 MIL/uL   Hemoglobin 14.4 13.0 - 18.0 g/dL   HCT 43.2 40.0 - 52.0 %   MCV 90.4 80.0 - 100.0 fL   MCH 30.0 26.0 - 34.0 pg   MCHC 33.2 32.0 - 36.0 g/dL   RDW 12.6 11.5 - 14.5 %   Platelets 262 150 - 440 K/uL  Comprehensive metabolic panel     Status: Abnormal   Collection Time: 04/01/15  8:38 PM  Result Value Ref Range   Sodium 134 (L) 135 - 145 mmol/L   Potassium 3.8 3.5 - 5.1 mmol/L   Chloride 100 (L) 101 - 111 mmol/L   CO2 27 22 - 32 mmol/L   Glucose, Bld 84 65 - 99 mg/dL   BUN 20 6 - 20 mg/dL   Creatinine, Ser 1.16 0.61 - 1.24 mg/dL   Calcium 9.5 8.9 - 10.3 mg/dL   Total Protein 8.5 (H) 6.5 - 8.1 g/dL   Albumin 4.9 3.5 - 5.0 g/dL   AST 53 (H) 15 - 41 U/L   ALT 22 17 - 63 U/L   Alkaline Phosphatase 54 38 - 126 U/L   Total Bilirubin 2.4 (H)  0.3 - 1.2 mg/dL   GFR calc non Af Amer >60 >60 mL/min   GFR calc Af Amer >60 >60 mL/min    Comment: (NOTE) The eGFR has been calculated using the CKD EPI equation. This calculation has not been validated in all clinical situations. eGFR's persistently <60 mL/min signify possible Chronic Kidney Disease.  Anion gap 7 5 - 15  Ethanol (ETOH)     Status: None   Collection Time: 04/01/15  8:38 PM  Result Value Ref Range   Alcohol, Ethyl (B) <5 <5 mg/dL    Comment:        LOWEST DETECTABLE LIMIT FOR SERUM ALCOHOL IS 5 mg/dL FOR MEDICAL PURPOSES ONLY   Salicylate level     Status: None   Collection Time: 04/01/15  8:38 PM  Result Value Ref Range   Salicylate Lvl <9.5 2.8 - 30.0 mg/dL  Urine Drug Screen, Qualitative ([facility] only)     Status: Abnormal   Collection Time: 04/01/15  8:38 PM  Result Value Ref Range   Tricyclic, Ur Screen NONE DETECTED NONE DETECTED   Amphetamines, Ur Screen NONE DETECTED NONE DETECTED   MDMA (Ecstasy)Ur Screen NONE DETECTED NONE DETECTED   Cocaine Metabolite,Ur [place] NONE DETECTED NONE DETECTED   Opiate, Ur Screen NONE DETECTED NONE DETECTED   Phencyclidine (PCP) Ur S NONE DETECTED NONE DETECTED   Cannabinoid 50 Ng, Ur [place] POSITIVE (A) NONE DETECTED   Barbiturates, Ur Screen NONE DETECTED NONE DETECTED   Benzodiazepine, Ur Scrn NONE DETECTED NONE DETECTED   Methadone Scn, Ur NONE DETECTED NONE DETECTED    Comment: (NOTE) 638  Tricyclics, urine               Cutoff 1000 ng/mL 200  Amphetamines, urine             Cutoff 1000 ng/mL 300  MDMA (Ecstasy), urine           Cutoff 500 ng/mL 400  Cocaine Metabolite, urine       Cutoff 300 ng/mL 500  Opiate, urine                   Cutoff 300 ng/mL 600  Phencyclidine (PCP), urine      Cutoff 25 ng/mL 700  Cannabinoid, urine              Cutoff 50 ng/mL 800  Barbiturates, urine             Cutoff 200 ng/mL 900  Benzodiazepine, urine           Cutoff 200 ng/mL 1000 Methadone, urine                Cutoff 300  ng/mL 1100 1200 The urine drug screen provides only a preliminary, unconfirmed 1300 analytical test result and should not be used for non-medical 1400 purposes. Clinical consideration and professional judgment should 1500 be applied to any positive drug screen result due to possible 1600 interfering substances. A more specific alternate chemical method 1700 must be used in order to obtain a confirmed analytical result.  1800 Gas chromato graphy / mass spectrometry (GC/MS) is the preferred 1900 confirmatory method.    Current Medications: No current facility-administered medications for this encounter.   PTA Medications: No prescriptions prior to admission    Previous Psychotropic Medications: Yes   Substance Abuse History in the last 12 months:  Yes.      Consequences of Substance Abuse: Medical Consequences:  Possibly, this is contributing to psychiatric illness and lack of compliance African presentations to the emergency room  Results for orders placed or performed during the hospital encounter of 04/01/15 (from the past 72 hour(s))  Acetaminophen level     Status: Abnormal   Collection Time: 04/01/15  8:38 PM  Result Value Ref Range   Acetaminophen (Tylenol), Serum <10 (L) 10 -  30 ug/mL    Comment:        THERAPEUTIC CONCENTRATIONS VARY SIGNIFICANTLY. A RANGE OF 10-30 ug/mL MAY BE AN EFFECTIVE CONCENTRATION FOR MANY PATIENTS. HOWEVER, SOME ARE BEST TREATED AT CONCENTRATIONS OUTSIDE THIS RANGE. ACETAMINOPHEN CONCENTRATIONS >150 ug/mL AT 4 HOURS AFTER INGESTION AND >50 ug/mL AT 12 HOURS AFTER INGESTION ARE OFTEN ASSOCIATED WITH TOXIC REACTIONS.   CBC     Status: None   Collection Time: 04/01/15  8:38 PM  Result Value Ref Range   WBC 5.1 3.8 - 10.6 K/uL   RBC 4.78 4.40 - 5.90 MIL/uL   Hemoglobin 14.4 13.0 - 18.0 g/dL   HCT 43.2 40.0 - 52.0 %   MCV 90.4 80.0 - 100.0 fL   MCH 30.0 26.0 - 34.0 pg   MCHC 33.2 32.0 - 36.0 g/dL   RDW 12.6 11.5 - 14.5 %    Platelets 262 150 - 440 K/uL  Comprehensive metabolic panel     Status: Abnormal   Collection Time: 04/01/15  8:38 PM  Result Value Ref Range   Sodium 134 (L) 135 - 145 mmol/L   Potassium 3.8 3.5 - 5.1 mmol/L   Chloride 100 (L) 101 - 111 mmol/L   CO2 27 22 - 32 mmol/L   Glucose, Bld 84 65 - 99 mg/dL   BUN 20 6 - 20 mg/dL   Creatinine, Ser 1.16 0.61 - 1.24 mg/dL   Calcium 9.5 8.9 - 10.3 mg/dL   Total Protein 8.5 (H) 6.5 - 8.1 g/dL   Albumin 4.9 3.5 - 5.0 g/dL   AST 53 (H) 15 - 41 U/L   ALT 22 17 - 63 U/L   Alkaline Phosphatase 54 38 - 126 U/L   Total Bilirubin 2.4 (H) 0.3 - 1.2 mg/dL   GFR calc non Af Amer >60 >60 mL/min   GFR calc Af Amer >60 >60 mL/min    Comment: (NOTE) The eGFR has been calculated using the CKD EPI equation. This calculation has not been validated in all clinical situations. eGFR's persistently <60 mL/min signify possible Chronic Kidney Disease.    Anion gap 7 5 - 15  Ethanol (ETOH)     Status: None   Collection Time: 04/01/15  8:38 PM  Result Value Ref Range   Alcohol, Ethyl (B) <5 <5 mg/dL    Comment:        LOWEST DETECTABLE LIMIT FOR SERUM ALCOHOL IS 5 mg/dL FOR MEDICAL PURPOSES ONLY   Salicylate level     Status: None   Collection Time: 04/01/15  8:38 PM  Result Value Ref Range   Salicylate Lvl <1.6 2.8 - 30.0 mg/dL  Urine Drug Screen, Qualitative ([facility] only)     Status: Abnormal   Collection Time: 04/01/15  8:38 PM  Result Value Ref Range   Tricyclic, Ur Screen NONE DETECTED NONE DETECTED   Amphetamines, Ur Screen NONE DETECTED NONE DETECTED   MDMA (Ecstasy)Ur Screen NONE DETECTED NONE DETECTED   Cocaine Metabolite,Ur [place] NONE DETECTED NONE DETECTED   Opiate, Ur Screen NONE DETECTED NONE DETECTED   Phencyclidine (PCP) Ur S NONE DETECTED NONE DETECTED   Cannabinoid 50 Ng, Ur [place] POSITIVE (A) NONE DETECTED   Barbiturates, Ur Screen NONE DETECTED NONE DETECTED   Benzodiazepine, Ur Scrn NONE DETECTED NONE DETECTED   Methadone Scn, Ur NONE  DETECTED NONE DETECTED    Comment: (NOTE) 010  Tricyclics, urine               Cutoff 1000 ng/mL 200  Amphetamines,  urine             Cutoff 1000 ng/mL 300  MDMA (Ecstasy), urine           Cutoff 500 ng/mL 400  Cocaine Metabolite, urine       Cutoff 300 ng/mL 500  Opiate, urine                   Cutoff 300 ng/mL 600  Phencyclidine (PCP), urine      Cutoff 25 ng/mL 700  Cannabinoid, urine              Cutoff 50 ng/mL 800  Barbiturates, urine             Cutoff 200 ng/mL 900  Benzodiazepine, urine           Cutoff 200 ng/mL 1000 Methadone, urine                Cutoff 300 ng/mL 1100 1200 The urine drug screen provides only a preliminary, unconfirmed 1300 analytical test result and should not be used for non-medical 1400 purposes. Clinical consideration and professional judgment should 1500 be applied to any positive drug screen result due to possible 1600 interfering substances. A more specific alternate chemical method 1700 must be used in order to obtain a confirmed analytical result.  1800 Gas chromato graphy / mass spectrometry (GC/MS) is the preferred 1900 confirmatory method.     Treatment Plan Summary: Daily contact with patient to assess and evaluate symptoms and progress in treatment and Medication management   24 year old single African-American male currently unemployed from [place]. Patient presented for the second time to our emergency department after having behavioral disturbances in the community. The patient first presented in 2015 after making threats again his family members. Now he presents after pointing a cell phone at strangers as if the cell phone was gun.  At examination the patient appears disorganized but calm and cooperative. Patient did not appear to be interacting to internal stimuli during the assessment and there was no evidence of mania or hypomania.  Urine toxicology was positive for cannabis.  Psychotic disorder: Unclear as to why  patient is displaying psychotic symptoms. It is possible that the psychotic symptoms are secondary to cannabis use but collateral information from family will be needed. For now the patient will be started on low-dose risperidone. I will order a head CT to rule out any organic causes for psychosis.  For tobacco use disorder patient declines from being started on nicotine patch on Nicotrol inhaler.  For agitation: start the patient on Ativan 2 mg every 8 hours when necessary as needed  Cannabis use disorder: Patient will be referred to a substance abuse outpatient treatment facility at discharge  Precautions continue every 15 minute checks  Hospitalization and status continue involuntary commitment.  Collateral information will have to be obtained from family members to clarify diagnosis  Labs I will order HIV, RPR, TSH, vitamin B12 and ammonia level.  Imaging: I will order a head CT.   Medical Decision Making:  Established Problem, Worsening (2)  I certify that inpatient services furnished can reasonably be expected to improve the patient's condition.   Hildred Priest 6/13/20165:00 PM

## 2015-04-02 NOTE — Plan of Care (Signed)
Problem: Ineffective individual coping Goal: LTG-Other (Specify)- Outcome: Not Progressing Inapprioprate behavior Goal: STG-Other (Specify): Outcome: Not Progressing Inapprioprate behavior.

## 2015-04-02 NOTE — ED Notes (Signed)
Pt taking a shower 

## 2015-04-02 NOTE — ED Provider Notes (Signed)
-----------------------------------------   2:29 PM on 04/02/2015 -----------------------------------------  Psychiatry has seen patient. They will admit.  Phineas Semen, MD 04/02/15 1430

## 2015-04-02 NOTE — ED Notes (Signed)

## 2015-04-02 NOTE — ED Notes (Signed)
BEHAVIORAL HEALTH ROUNDING Patient sleeping: Yes.   Patient alert and oriented: not applicable Behavior appropriate: Yes.  ; If no, describe:  Nutrition and fluids offered: Yes  Toileting and hygiene offered: Yes  Sitter present: no Law enforcement present: Yes  

## 2015-04-02 NOTE — Tx Team (Signed)
Initial Interdisciplinary Treatment Plan   PATIENT STRESSORS: Financial difficulties Marital or family conflict   PATIENT STRENGTHS: Manufacturing systems engineer Physical Health   PROBLEM LIST: Problem List/Patient Goals Date to be addressed Date deferred Reason deferred Estimated date of resolution  Altered perception      Homelessness                                                 DISCHARGE CRITERIA:  Ability to meet basic life and health needs Improved stabilization in mood, thinking, and/or behavior  PRELIMINARY DISCHARGE PLAN: Outpatient therapy Placement in alternative living arrangements  PATIENT/FAMIILY INVOLVEMENT: This treatment plan has been presented to and reviewed with the patient, Ryan Colon.  The patient and family have been given the opportunity to ask questions and make suggestions.  Benson Porcaro, Sarajane Marek 04/02/2015, 6:17 PM

## 2015-04-02 NOTE — Progress Notes (Signed)
Zyden pacing.Slow to answer questions.Med compliant.No interaction with peers.Rachit has in hand in his pants constantly.Asked what he was doing only mumbled to himself.Then noted to be masturbating in dayroom.Redirected by security could not have hand in his pants in dayroom.Will continue to redirect as needed and safe environment.

## 2015-04-02 NOTE — ED Notes (Signed)
Pt given breakfast tray

## 2015-04-02 NOTE — Plan of Care (Signed)
Problem: Consults Goal: [facility] General Treatment Patient Education Outcome: Progressing Patient oriented to unit and treatment agreement. He is pleasant and cooperative but seems to be responding to internal stimuli and had hand in pants the entire time admission assessment was being done.

## 2015-04-03 LAB — RPR: RPR Ser Ql: NONREACTIVE

## 2015-04-03 LAB — HIV ANTIBODY (ROUTINE TESTING W REFLEX): HIV SCREEN 4TH GENERATION: NONREACTIVE

## 2015-04-03 LAB — HEMOGLOBIN A1C: Hgb A1c MFr Bld: 5.5 % (ref 4.0–6.0)

## 2015-04-03 MED ORDER — TRAZODONE HCL 50 MG PO TABS
50.0000 mg | ORAL_TABLET | Freq: Every evening | ORAL | Status: DC | PRN
  Administered 2015-04-04 – 2015-04-07 (×2): 50 mg via ORAL
  Filled 2015-04-03 (×2): qty 1

## 2015-04-03 MED ORDER — RISPERIDONE 1 MG PO TABS: 1.0000 mg | ORAL_TABLET | Freq: Every day | ORAL | Status: DC

## 2015-04-03 NOTE — Progress Notes (Signed)
Alert and oriented. Resting in his room. Denies suicidal and homicidal ideations.

## 2015-04-03 NOTE — Progress Notes (Signed)
Patient os alert and oriented x 3 - he states he does not really know why he is on a mental health unit. He cannot explain his actions that required police intervention and ED visit. He denies any psychiatric problems. His conversation is very vague and superficial. He says he does want assistance with finding a place to live as his family will no longer support him.  Will continue to assess patient, monitor for s/s psychosis or mood disorder. Encourage participation in groups and milieu.

## 2015-04-03 NOTE — Progress Notes (Signed)
Recreation Therapy Notes  Date: 06.14.16 Time: 3:00 pm Location: Craft Room  Group Topic: Goal Setting  Goal Area(s) Addresses:  Patient will list at least one goal. Patient will list at least one obstacle.  Behavioral Response: Attentive, Interactive  Intervention: Recovery Goal Chart  Activity: Patients were instructed to make a goal chart with goals, obstacles, the date they started working on their goal, and the date they achieved their goal.   Education: LRT educated patients on different healthy ways they can celebrate reaching their goal.   Education Outcome: Acknowledges education/In group clarification offered  Clinical Observations/Feedback: Patient completed activity. Patient contributed to group discussion but would talk about "half of a heart" and "attacking his goals".  Jacquelynn Cree, LRT/CTRS 04/03/2015 4:37 PM

## 2015-04-03 NOTE — Plan of Care (Signed)
Problem: Ineffective individual coping Goal: LTG: Patient will report a decrease in negative feelings Outcome: Progressing Reports that he feels "OK"

## 2015-04-03 NOTE — [facility] Group Notes (Signed)
[facility] Group Notes:  (Nursing/MHT/Case Management/Adjunct)  Date:  04/03/2015  Time:  1:37 AM  Type of Therapy:  Group Therapy  Participation Level:  Did Not Attend    Ryan Colon 04/03/2015, 1:37 AM
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
## 2015-04-03 NOTE — Progress Notes (Signed)
Patient is calm and pleasant. He is polite and respectful with this nurse. Feels that people think he has mental illness, but rationalizes behavior that others perceive as psychotic. "I was just pointing my cell phone at people. I didn't mean anything by it." Denies suicidal and homicidal thoughts. Reports that he is not sure whether or not he is mentally ill.

## 2015-04-04 MED ORDER — RISPERIDONE 0.5 MG PO TABS
0.5000 mg | ORAL_TABLET | Freq: Every day | ORAL | Status: DC
  Administered 2015-04-04: 0.5 mg via ORAL
  Filled 2015-04-04: qty 1

## 2015-04-04 NOTE — [facility] Group Notes (Signed)
[facility] Group Notes:  (Nursing/MHT/Case Management/Adjunct)  Date:  04/04/2015  Time:  12:14 PM  Type of Therapy:  Psychoeducational Skills  Participation Level:  Active  Participation Quality:  Attentive and Sharing   Affect:  Not Congruent   Cognitive:  Disorganized, Confused and Delusional   Insight:  Lacking and Limited  Engagement in Group:  Developing/Improving  Modes of Intervention:  Confrontation, Discussion, Education, Exploration, Problem-solving and Support  Summary of Progress/Problems:  Ryan Colon 04/04/2015, 12:14 PM

## 2015-04-04 NOTE — [facility] Group Notes (Signed)
[facility] LCSW Group Therapy  04/04/2015 7:41 AM  Type of Therapy:  Group Therapy  Participation Level:  Did Not Attend  Participation Quality:    Affect:    Cognitive:    Insight:    Engagement in Therapy:    Modes of Intervention:    Summary of Progress/Problems:  Ryan Colon M 04/04/2015, 7:41 AM  

## 2015-04-04 NOTE — Plan of Care (Signed)
Problem: Alteration in thought process Goal: STG-Patient is able to identify plan for continuing care at ( Patient is able to identify continuing plan for care at discharge)  Outcome: Not Progressing Thoughts disorganized and unable to verbalize a legitimate goal/plan

## 2015-04-04 NOTE — Progress Notes (Signed)
Recreation Therapy Notes  Date: 06.15.16 Time: 3:00 pm Location: Craft Room  Group Topic: Self-esteem, coping skills  Goal Area(s) Addresses:  Patient will identify positive attributes about self. Patient will identify at least one coping skill.  Behavioral Response: Attentive  Intervention: All About Me  Activity: Patients were instructed to make an All About Me pamphlet including positive traits, healthy coping skills, and their healthy support system.  Education:LRT educated patients on ways to increase their self-esteem.  Education Outcome: Acknowledges education/In group clarification offered  Clinical Observations/Feedback: Patient arrived to group at approximately 3:20 pm. Patient wrote down different wars and such on his paper. Patient would write what he was supposed to in between the different wars and such that he was writing. Patient did not contribute to group discussion.  Jacquelynn Cree, LRT/CTRS 04/04/2015 4:42 PM

## 2015-04-04 NOTE — Plan of Care (Signed)
Problem: Ineffective individual coping Goal: LTG: Patient will report a decrease in negative feelings Outcome: Progressing Denies any depression or anxiety
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
## 2015-04-04 NOTE — Progress Notes (Signed)
Patient unable to verbalize why he is here.  Per MHT during 1100 group patient attempted to put his hand down his pants and she informed him not to. Requested new pair of pants from this writer because he had a hole in his current pair.  When asked what size he needed, patient pulled pants down in the middle of hall.  Patient informed that was inappropriate and to not to do that again.  Patient stated "okay"  No interaction noted with peers but noted in halls dancing and mimicking playing sports.

## 2015-04-04 NOTE — [facility] Group Notes (Signed)
[facility] LCSW Aftercare Discharge Planning Group Note   04/04/2015 10:12 AM  Participation Quality:  Did not attend.   Rondall Allegra, MSW, [place]

## 2015-04-05 MED ORDER — RISPERIDONE 1 MG PO TABS
2.0000 mg | ORAL_TABLET | Freq: Every day | ORAL | Status: DC
  Administered 2015-04-05 – 2015-04-07 (×3): 2 mg via ORAL
  Filled 2015-04-05 (×3): qty 2

## 2015-04-05 MED ORDER — RISPERIDONE 1 MG PO TABS: 1.0000 mg | ORAL_TABLET | Freq: Every day | ORAL | Status: DC

## 2015-04-05 NOTE — Plan of Care (Signed)
Problem: Alteration in thought process Goal: LTG-Patient verbalizes understanding importance med regimen (Patient verbalizes understanding of importance of medication regimen and need to continue outpatient care.)  Outcome: Progressing  Patient is compliant with medication regimen.      

## 2015-04-05 NOTE — Progress Notes (Signed)
Alert and oriented x 3 denies pain and discomfort, affect is blunted, some hyper activity and pressured speech noted, no inappropriate sexual behavior noted, denies SI/HI, 15 minutes checks maintained will continue to monitor.

## 2015-04-05 NOTE — [facility] Group Notes (Signed)
[facility] Group Notes:  (Nursing/MHT/Case Management/Adjunct)  Date:  04/05/2015  Time:  2:08 PM  Type of Therapy:  Movement Therapy  Participation Level:  Active  Participation Quality:  Appropriate and Attentive  Affect:  Appropriate  Cognitive:  Alert, Appropriate and Oriented  Insight:  Appropriate  Engagement in Group:  Engaged  Modes of Intervention:  Activity  Summary of Progress/Problems:  Ryan Colon Ryan Colon 04/05/2015, 2:08 PM
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
## 2015-04-05 NOTE — Progress Notes (Signed)
Disheveled, disorganized, bizarre in behavior, imparled insight and judgment, concrete and unable to abstract; visible in the milieu and interacting, to some extent, fairly with others; he was observed playing card game after he was redirected concerning inappropriate, offensive, inconsiderate sexual behaviors.Will continue to redirect and remind patient to respect others.

## 2015-04-05 NOTE — [facility] Group Notes (Signed)
[facility] LCSW Group Therapy  04/05/2015 8:34 AM  Type of Therapy:  Group Therapy  Participation Level:  Active  Participation Quality:  Attentive  Affect:  Appropriate  Cognitive:  Appropriate  Insight:  Developing/Improving  Engagement in Therapy:  Developing/Improving  Modes of Intervention:  Discussion, Education, Exploration and Support  Summary of Progress/Problems:LCSW introduced group rules today's topic was Balance in life and relapse prevention. Patients were asked to reflect on challenges they have once in the community and barriers to their success. Patients were then asked in a supportive collaboration to come up with ideas that could support overcoming their barriers and challenges. He would laugh and try to be supportive by the end of the group he was fully engaged and seemed to be accepted and supported by his peers  Cheron Schaumann 04/05/2015, 8:34 AM

## 2015-04-05 NOTE — [facility] Group Notes (Signed)
[facility] Group Notes:  (Nursing/MHT/Case Management/Adjunct)  Date:  04/05/2015  Time:  1:45 AM  Type of Therapy:  Group Therapy  Participation Level:  Active  Participation Quality:  Resistant  Affect:  Not Congruent  Cognitive:  Disorganized  Insight:  Lacking  Engagement in Group:  Off Topic  Modes of Intervention:  Discussion  Summary of Progress/Problems: The pt was very hard to follow. When asked about his goals. Pt said that he was preparing for his tour to [place]. He said that he was doing this by exercising in his room. He said he does this by lifting his mattress numerous times, and doing barrel rolls. When staff tried to redirect him he said that he did not feel like he needed to be here because he does not have allergies like the other patients. Staff was able to get pt to finish his thought when told that others needed a chance to speak. He stopped and laughed to himself until group ended.   Fanny Skates Mahaila Tischer 04/05/2015, 1:45 AM
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
## 2015-04-05 NOTE — Progress Notes (Signed)
Recreation Therapy Notes  Date: 06.16.16 Time: 3:05 pm Location: Craft Room  Group Topic: Coping Skills/Leisure Education  Goal Area(s) Addresses:  Patient will identify things they are grateful for. Patient will identify how being grateful can influence your decision making.  Behavioral Response: Attentive, Interactive  Intervention: Grateful Wheel  Activity: Patients were given an "I Am Grateful For" worksheet and instructed to list at least one thing they were grateful for under each category.   Education: LRT educated patient on leisure and why it is important to implement it into their schedules.  Education Outcome: Acknowledges education/In group clarification offered  Clinical Observations/Feedback: Patient participated in group activity. Patient contributed to group discussion by stating things he was grateful for and which category had more thing in it.  Jacquelynn Cree, LRT/CTRS 04/05/2015 4:57 PM

## 2015-04-05 NOTE — Progress Notes (Signed)
Recreation Therapy Notes  INPATIENT RECREATION THERAPY ASSESSMENT  Patient Details Name: Ryan Colon MRN: [id number] DOB: 16-Jun-1991 Today's Date: 04/05/2015  Patient Stressors:  Patient reported no stressors.  Coping Skills:   Substance Abuse, Exercise, Art/Dance, Talking, Music, Sports  Personal Challenges:  Patient reported no personal challenges.  Leisure Interests (2+):  Individual - Other (Comment) (Play fight, watch romantic movies)  Awareness of Community Resources:  Yes  Community Resources:  Other (Comment) ([facility])  Current Use: Yes  If no, Barriers?:    Patient Strengths:  Work Associate Professor, patriotism  Patient Identified Areas of Improvement:  Being a more loyal partner  Current Recreation Participation:  Trying to come up with ideas for other people to have fun  Patient Goal for Hospitalization:  To bring himself into a rhythm so people on the outside don't see him as a threat sexually or physically  [place] of Residence:  [place]  [place] of Residence:  [place]   Current SI (including self-harm):  No  Current HI:  No  Consent to Intern Participation: N/A  Due to patient reporting no personal challenges, LRT will not develop a Recreational Therapy Care Plan. If patient's status changes, LRT will develop a Recreational Therapy Care Plan.  Jacquelynn Cree, LRT/CTRS 04/05/2015, 5:36 PM
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
## 2015-04-05 NOTE — [facility] Group Notes (Signed)
[facility] Group Notes:  (Nursing/MHT/Case Management/Adjunct)  Date:  04/05/2015  Time:  10:20 AM  Type of Therapy:  goals  Participation Level:  Minimal  Participation Quality:  Redirectable  Affect:  Flat  Cognitive:  Hallucinating  Insight:  Improving  Engagement in Group:  Limited  Modes of Intervention:  goal setting   Summary of Progress/Problems:  Ryan Colon 04/05/2015, 10:20 AM

## 2015-04-05 NOTE — [facility] Group Notes (Signed)
[facility] LCSW Group Therapy  04/05/2015 5:20 PM  Type of Therapy:  Group Therapy  Participation Level:  Minimal  Participation Quality:  Sharing  Affect:  Appropriate  Cognitive:  Appropriate  Insight:  Lacking  Engagement in Therapy:  Developing/Improving  Modes of Intervention:  Education, Exploration and Support  Summary of Progress/Problems:Patient was appropriate today in group > He reports that honesty is important  Cheron Schaumann 04/05/2015, 5:20 PM

## 2015-04-06 DIAGNOSIS — F203 Undifferentiated schizophrenia: Secondary | ICD-10-CM | POA: Diagnosis present

## 2015-04-06 DIAGNOSIS — F12959 Cannabis use, unspecified with psychotic disorder, unspecified: Secondary | ICD-10-CM

## 2015-04-06 MED ORDER — IBUPROFEN 600 MG PO TABS
600.0000 mg | ORAL_TABLET | Freq: Every day | ORAL | Status: AC
  Administered 2015-04-06 – 2015-04-08 (×3): 600 mg via ORAL
  Filled 2015-04-06 (×3): qty 1
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
## 2015-04-06 NOTE — Plan of Care (Signed)
Problem: Alteration in thought process Goal: LTG-Patient has not harmed self or others in at least 2 days Outcome: Progressing No self harm     

## 2015-04-06 NOTE — Progress Notes (Signed)
Recreation Therapy Notes  Date: 06.17.16 Time: 3:00 pm Location: Craft Room  Group Topic: Self-expression/coping skills  Goal Area(s) Addresses:  Patient will effectively use art as a means of self-expression. Patient recognize positive benefit for self-expression. Patient will be able to identify one emotion experienced during group session. Patient will identify use of art/self-expression as a coping skill.  Behavioral Response: Attentive  Intervention: Two Faces of Me  Activity: Patients were given a blank face worksheet and instructed to draw or write how they felt when they were admitted to the hospital on one said and draw or write how they want to feel when they are d/c on the other side.  Education: LRT educated patients on different forms of self-expression.   Education Outcome: In group clarification offered  Clinical Observations/Feedback: Patient arrived to group at approximately 3:20 pm. Patient wrote on the worksheet. Patient did not contribute to group discussion. Patient stared and smiled at LRT during processing.  Jacquelynn Cree, LRT/CTRS 04/06/2015 4:22 PM

## 2015-04-06 NOTE — Plan of Care (Signed)
Problem: Ineffective individual coping Goal: LTG: Patient will report a decrease in negative feelings Outcome: Not Progressing No negative feelings reported, notes he is elated about the basketball game on television.  Goal: STG:Pt. will utilize relaxation techniques to reduce stress STG: Patient will utilize relaxation techniques to reduce stress levels  Outcome: Progressing Enjoying basketball. Goal: STG-Increase in ability to manage activities of daily living Outcome: Progressing Able to provide self-care appropriately.

## 2015-04-06 NOTE — B[facility] Group Notes (Signed)
Adult Psychoeducational Group Note  Date:  04/06/2015 Time:  11:50 PM  Group Topic/Focus:  Wrap-Up Group:   The focus of this group is to help patients review their daily goal of treatment and discuss progress on daily workbooks.  Participation Level:  Minimal  Participation Quality:  Appropriate  Affect:  Appropriate  Cognitive:  Appropriate  Insight: Improving  Engagement in Group:  Improving  Modes of Intervention:  Discussion  Additional Comments:  Patient had to be asked to please remove his hands out of his pants before group began.  [name] Ivi Griffith 04/06/2015, 11:50 PM

## 2015-04-06 NOTE — Progress Notes (Signed)
The patient is pleasant and cooperative. He was noted to be masturbating in the hall and in the day room at one point during the evening. He noted being extremely excited about the [employer], stating he was not able to watch them last year. He literally jumped up and down in excitement. He noted no needs or distress. He denies SI, HI, and AVH.

## 2015-04-06 NOTE — [facility] Group Notes (Signed)
[facility] Group Notes:  (Nursing/MHT/Case Management/Adjunct)  Date:  04/06/2015  Time:  12:04 PM  Type of Therapy:  Group Therapy  Participation Level:  Minimal  Participation Quality:  Inattentive  Affect:  Appropriate  Cognitive:  Confused  Insight:  Lacking and Limited  Engagement in Group:  Poor  Modes of Intervention:  Activity, Discussion and Education  Summary of Progress/Problems:  Ryan Colon 04/06/2015, 12:04 PM

## 2015-04-06 NOTE — [facility] Group Notes (Signed)
[facility] LCSW Aftercare Discharge Planning Group Note  04/06/2015 11:20 AM  Participation Quality:  Did not attend  Affect:    Cognitive:    Insight:    Engagement in Group:    Modes of Intervention:    Summary of Progress/Problems:  Ryan Colon 04/06/2015, 11:20 AM

## 2015-04-06 NOTE — Plan of Care (Signed)
Problem: Alteration in thought process Goal: STG-Patient is able to discuss thoughts with staff Outcome: Not Progressing Requires redirection.  Disorganized thoughts

## 2015-04-06 NOTE — [facility] Group Notes (Signed)
[facility] LCSW Group Therapy  04/06/2015 5:14 PM  Type of Therapy:  Group Therapy  Participation Level:  Active  Participation Quality:  Supportive  Affect:  Appropriate  Cognitive:  Disorganized  Insight:  Improving  Engagement in Therapy:  Developing/Improving  Modes of Intervention:  Discussion, Exploration and Support  Summary of Progress/Problems: LCSW reviewed group rules with each patient. It was a calm small group and we mad our focus on things we can do to promote our wellness, happiness and relationship obstacles. Patients were asked to share and reflect on personal dating experiences and then support one another with positive affirmations and funny life quotes. Patient while listening to his peers wrote some life realities and read 2 pages out loud about his thoughts on summer love. He really left his peers speechless with his insightful words, so even though this patient wasn't talking a lot he was thinking and reflecting his thoughts in an organized way on paper.   Johnella Moloney, Glenora Morocho M 04/06/2015, 5:14 PM
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
## 2015-04-06 NOTE — [facility] Suicide Risk Assessment (Signed)
[facility] INPATIENT:  Family/Significant Other Suicide Prevention Education  Suicide Prevention Education:  Education Completed; Jermelle Maycock (father) [phone] has been identified by the patient as the family member/significant other with whom the patient will be residing, and identified as the person(s) who will aid the patient in the event of a mental health crisis (suicidal ideations/suicide attempt).  With written consent from the patient, the family member/significant other has been provided the following suicide prevention education, prior to the and/or following the discharge of the patient.  The suicide prevention education provided includes the following:  Suicide risk factors  Suicide prevention and interventions  National Suicide Hotline telephone number  [facility] assessment telephone number  [facility] Emergency Assistance 911  [facility] and/or Residential Mobile Crisis Unit telephone number  Request made of family/significant other to:  Remove weapons (e.g., guns, rifles, knives), all items previously/currently identified as safety concern.    Remove drugs/medications (over-the-counter, prescriptions, illicit drugs), all items previously/currently identified as a safety concern.  The family member/significant other verbalizes understanding of the suicide prevention education information provided.  The family member/significant other agrees to remove the items of safety concern listed above. Mr. Trimmer agreed to follow crisis plan but does not want patient living with him.   Rondall Allegra, MSW, LCSWA  04/06/2015, 9:32 AM

## 2015-04-06 NOTE — [facility] Group Notes (Signed)
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 But, put more emphasis on working out and how exercising helped him get through his days here.   Fanny Skates Trivia Heffelfinger 04/06/2015, 1:35 AM

## 2015-04-06 NOTE — Plan of Care (Signed)
Problem: Alteration in thought process Goal: LTG-Patient is able to perceive the environment accurately Outcome: Not Progressing Requires frequent reminding of not to run in halls

## 2015-04-07 NOTE — [facility] Group Notes (Signed)
[facility] LCSW Group Therapy  04/07/2015 2:37 PM  Type of Therapy:  Group Therapy  Participation Level:  Active  Participation Quality:  Attentive  Affect:  Appropriate  Cognitive:  Appropriate  Insight:  Improving  Engagement in Therapy:  Developing/Improving  Modes of Intervention:  Rapport Building and Socialization  Summary of Progress/Problems:LCSW introduced group rules and held group outdoors today. We focused on understanding our own communication styles and peers were encouraged to support each other on new ways to communicate. Examples used and discussed was body language, verbal exchanges,knowing when to speak up and out and know when not to interrupt. This patient was able to reflect his ability to wait through group and then really enjoyed playing basketball   Cheron Schaumann 04/07/2015, 2:37 PM

## 2015-04-07 NOTE — Progress Notes (Signed)
Patient is pleasant and cooperative but continues to be hypersexual. He denies SI, HI, and AVH. No needs or distress are noted. He is interactive in the milieu and was appropriate in his behavior.

## 2015-04-07 NOTE — Progress Notes (Signed)
Pt continues to exhibit inappropriate behaviors at times( masturbating).Pt has been seen talking to himself and laughing inappropriately. Pt needs frequent redirecting.
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
## 2015-04-08 MED ORDER — RISPERIDONE 3 MG PO TABS
3.0000 mg | ORAL_TABLET | Freq: Every day | ORAL | Status: DC
  Administered 2015-04-08: 3 mg via ORAL
  Filled 2015-04-08: qty 1

## 2015-04-08 NOTE — [facility] Group Notes (Signed)
[facility] Group Notes:  (Nursing/MHT/Case Management/Adjunct)  Date:  04/08/2015  Time:  9:52 AM  Type of Therapy:  Goals   Participation Level:  Minimal  Participation Quality:  Attentive  Affect:  Blunted  Cognitive:  Oriented  Insight:  Improving  Engagement in Group:  Poor  Modes of Intervention:  Goal Setting  Summary of Progress/Problems:  Ryan Colon 04/08/2015, 9:52 AM

## 2015-04-08 NOTE — [facility] Group Notes (Signed)
[facility] LCSW Group Therapy  04/08/2015 3:11 PM  Type of Therapy:  Group Therapy  Participation Level:  Minimal  Participation Quality:  Attentive  Affect:  Flat  Cognitive:  Alert  Insight:  Limited  Engagement in Therapy:  Limited  Modes of Intervention:  Discussion, Education, Problem-solving, Socialization and Support  Summary of Progress/Problems: Balance in life: Patients will discuss the concept of balance and how it looks and feels to be unbalanced. Pt will identify areas in their life that is unbalanced and ways to become more balanced. Ryan Colon identified his father as his support system. During group, he was excessively touching his gentials. He was asked to remove his hands from his pants. He did so without argument.    Rondall Allegra, MSW, LCSWA 04/08/2015, 3:11 PM

## 2015-04-08 NOTE — Progress Notes (Signed)
Pt noted to be a little less inappropriate this period. Some redirecting needed around his sexually inappropriateness the patient responded fairly well to staffs redirecting. Pt given prn ativan at 1629hrs which appeared to be effective.

## 2015-04-08 NOTE — Plan of Care (Signed)
Problem: Ineffective individual coping Goal: LTG: Patient will report a decrease in negative feelings Outcome: Progressing Pt did state he was feeling better today compared to the past few days.   Problem: Alteration in thought process Goal: LTG-Patient verbalizes understanding importance med regimen (Patient verbalizes understanding of importance of medication regimen and need to continue outpatient care.)  Outcome: Progressing Pt stated he was going to continue to take his medications when he leaves and that it was very important to continue to take his medications when he leaves.

## 2015-04-08 NOTE — Progress Notes (Signed)
D: Pt denies SI/HI/AVH. Pt is pleasant and cooperative. Pt exhibited no inappropriate sexual behavior (masturbation publicly) . Pt appeared to be possibly responding, but pt stated he was having a hard time on the outside due to him having no money or job.   A: Pt was offered support and encouragement. Pt was given scheduled medications. Pt was encourage to attend groups. Q 15 minute checks were done for safety.   R:Pt attends groups and interacts  with peers and staff. Pt is taking medication. Pt has no complaints at this time  .Pt receptive to treatment and safety maintained on unit.
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
## 2015-04-09 MED ORDER — PSEUDOEPHEDRINE HCL 30 MG PO TABS
30.0000 mg | ORAL_TABLET | Freq: Three times a day (TID) | ORAL | Status: AC
  Administered 2015-04-09 – 2015-04-11 (×5): 30 mg via ORAL
  Filled 2015-04-09 (×9): qty 1

## 2015-04-09 MED ORDER — DIPHENHYDRAMINE HCL 25 MG PO CAPS
25.0000 mg | ORAL_CAPSULE | Freq: Every day | ORAL | Status: DC
  Administered 2015-04-09: 25 mg via ORAL
  Filled 2015-04-09: qty 1

## 2015-04-09 MED ORDER — RISPERIDONE 3 MG PO TABS
4.0000 mg | ORAL_TABLET | Freq: Every day | ORAL | Status: DC
  Administered 2015-04-09: 4 mg via ORAL
  Filled 2015-04-09: qty 1

## 2015-04-09 NOTE — Progress Notes (Signed)
Patient is alert and oriented x 4. He was reminded re ADLs and wearing clean clothes. Currently no evidence of inappropriate sexual behavior - will continue to monitor and educate patient. Patient has been attending some groups. He continues to deny any psychiatric issues; states he is here just for behavioral problems with family. Will continue current treatment plan as written.

## 2015-04-09 NOTE — Progress Notes (Signed)
Recreation Therapy Notes  Date: 06.20.16 Time: 3:00 pm Location: Craft Room  Group Topic: Self-expression  Goal Area(s) Addresses:  Patient will identify one color per emotion listed on wheel. Patient will verbalize benefit of using art as a means of self-expression. Patient will verbalize one emotion experienced during session. Patient will be educated on other forms of self-expression.  Behavioral Response: Attentive, Interactive  Intervention: Emotion Wheel  Activity: Patients were given a worksheet with 7 different emotions and were instructed to pick a color for each emotion.   Education: LRT educated patient on different forms of self-expression.   Education Outcome: Acknowledges education/In group clarification offered   Clinical Observations/Feedback: Patient completed activity by drawing symbols. Patient contributed to group discussion by stating what colors and symbols  he picked for certain emotions, and what emotions he experienced during group.  Jacquelynn Cree, LRT/CTRS 04/09/2015 4:13 PM

## 2015-04-09 NOTE — Progress Notes (Signed)
D: Pt denies SI/HI/AVH. Pt is pleasant and cooperative. Pt not observed or reported of public masturbation. Pt was observed masturbating in room after group. Pt stated he was doing better with not acting inappropriate in public.   A: Pt was offered support and encouragement. Pt was given scheduled medications. Pt was encourage to attend groups. Q 15 minute checks were done for safety.   R:Pt attends groups and interacts well with peers and staff. Pt is taking medication. Pt has no complaints at this time .Pt receptive to treatment and safety maintained on unit.
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
## 2015-04-09 NOTE — Progress Notes (Signed)
D: Pt denies SI/HI/AVH. Pt is cooperative with care, frequent redirection, no sexually inappropriate actions. Pt stated he feels better, interacting with peers and staff appropriately.  A: Pt was offered support and encouragement. Pt was given scheduled medications. Pt was encouraged to attend groups. Q 15 minute checks were done for safety.  R: Pt attends groups and interacts well with peers and staff appropriately,  Pt is compliant with medication. Pt has no complaints.Pt receptive to treatment and safety maintained on unit.

## 2015-04-09 NOTE — Plan of Care (Signed)
Problem: Ineffective individual coping Goal: LTG: Patient will report a decrease in negative feelings Outcome: Progressing Patient denies SI/HI/AVH.      

## 2015-04-10 MED ORDER — PALIPERIDONE ER 3 MG PO TB24
6.0000 mg | ORAL_TABLET | Freq: Every day | ORAL | Status: DC
  Administered 2015-04-10: 6 mg via ORAL
  Filled 2015-04-10: qty 2

## 2015-04-10 NOTE — Plan of Care (Signed)
Problem: Ineffective individual coping Goal: LTG: Patient will report a decrease in negative feelings Outcome: Progressing Improved insight but still in denial to the "misuse and abuse" of marijuana; "I don't shoot or snore ..." Feeling drowsy this morning, "I just want to cath up on my rest.." Goal: STG: Pt will be able to identify effective and ineffective STG: Pt will be able to identify effective and ineffective coping patterns  Outcome: Progressing Patient remain free from harm and injury during this shift, missed the first group session, plan to attend other upcoming groups.

## 2015-04-10 NOTE — Progress Notes (Signed)
Initial Nutrition Assessment    Pt meeting nutritional goals at this time, no nutritional intervention warranted. Will sign off. Please reconsult RD if additional nutritional issues arise.    NUTRITION DIAGNOSIS:  No PES statement at this time  GOAL:  Patient will meet greater than or equal to 90% of their needs; pt meeting nutritional goals at this time   REASON FOR ASSESSMENT:   (Length of Stay)    ASSESSMENT:  Pt admitted with schizophrenia  PMH:  History reviewed. No pertinent past medical history.  Diet Order: Regular  Energy Intake: pt eating mostly 100% of all meals  Labs: reviewed  Meds: reviewed   Height:  Ht Readings from Last 1 Encounters:  04/02/15 5\' 8"  (1.727 m)    Weight:  Wt Readings from Last 1 Encounters:  04/02/15 178 lb (80.74 kg)    BMI:  Body mass index is 27.07 kg/(m^2).   Romelle Starcher MS, RD, LDN [phone] Pager

## 2015-04-10 NOTE — [facility] Group Notes (Signed)
[facility] Group Notes:  (Nursing/MHT/Case Management/Adjunct)  Date:  04/10/2015  Time:  10:54 PM  Type of Therapy:  Group Therapy  Participation Level:  Active  Participation Quality:  Appropriate  Affect:  Appropriate  Cognitive:  Appropriate  Insight:  Improving  Engagement in Group:  Developing/Improving  Modes of Intervention:  n/a  Summary of Progress/Problems:  Veva Holes 04/10/2015, 10:54 PM

## 2015-04-10 NOTE — Progress Notes (Signed)
Recreation Therapy Notes  Date: 06.21.16 Time: 3:00 pm Location: Craft Room  Group Topic: Goal Setting   Goal Area(s) Addresses:  Patient will be able to identify one goal. Patient will verbalize benefit of setting goals. Patient will be able to identify at least one positive statement.  Behavioral Response: Did not attend  Intervention: Step By Step  Activity: Patients were given a worksheet with a foot on it. Patients were instructed to write a goal on the inside of the foot and to write positive statements/advice on the outside of the foot.  Education: LRT educated patients on healthy ways to celebrate achieving their goals.   Education Outcome: Patient did not attend group.   Clinical Observations/Feedback: Patient did not attend group.  Jacquelynn Cree, LRT/CTRS 04/10/2015 4:03 PM

## 2015-04-10 NOTE — [facility] Group Notes (Signed)
[facility] Group Notes:  (Nursing/MHT/Case Management/Adjunct)  Date:  04/10/2015  Time:  3:23 PM  Type of Therapy:  Psychoeducational Skills  Participation Level:  Active  Participation Quality:  Sharing  Affect:  Flat  Cognitive:  Oriented  Insight:  Improving  Engagement in Group:  Engaged  Modes of Intervention:  Support  Summary of Progress/Problems:  Marquette Old 04/10/2015, 3:23 PM

## 2015-04-10 NOTE — Plan of Care (Signed)
Problem: Alteration in thought process Goal: LTG-Patient behavior demonstrates decreased signs psychosis (Patient behavior demonstrates decreased signs of psychosis to the point the patient is safe to return home and continue treatment in an outpatient setting.)  Outcome: Progressing Pt denies AVH at this time and does not appear to be responding to internal stimuli.  Goal: LTG-Patient verbalizes understanding importance med regimen (Patient verbalizes understanding of importance of medication regimen and need to continue outpatient care.)  Pt takes medications as prescribed, pt says he will take medications when he leaves.
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
## 2015-04-10 NOTE — Progress Notes (Signed)
Observed in bed still resting, poor eye contact, denied pain, "I just want to catch up with my sleep ... I am still drowsy from the sleeping medications that I received last night .." Chart reviewed, patient received risperidone and benadryl as schedule at bedtime.

## 2015-04-11 MED ORDER — PALIPERIDONE ER 3 MG PO TB24
9.0000 mg | ORAL_TABLET | Freq: Every day | ORAL | Status: DC
  Administered 2015-04-11 – 2015-04-17 (×7): 9 mg via ORAL
  Filled 2015-04-11: qty 2
  Filled 2015-04-11 (×7): qty 3

## 2015-04-11 MED ORDER — ZOLPIDEM TARTRATE 5 MG PO TABS
5.0000 mg | ORAL_TABLET | Freq: Every day | ORAL | Status: DC
  Administered 2015-04-11: 5 mg via ORAL
  Filled 2015-04-11: qty 1

## 2015-04-11 MED ORDER — ZOLPIDEM TARTRATE 5 MG PO TABS
5.0000 mg | ORAL_TABLET | Freq: Every evening | ORAL | Status: DC | PRN
Start: 2015-04-11 — End: 2015-04-11
  Administered 2015-04-11: 5 mg via ORAL
  Filled 2015-04-11: qty 1

## 2015-04-11 MED ORDER — DIPHENHYDRAMINE HCL 25 MG PO CAPS
ORAL_CAPSULE | ORAL | Status: AC
  Filled 2015-04-11: qty 1

## 2015-04-11 NOTE — Progress Notes (Signed)
Put order in for Ambien at 121, called pharmacy at 140  to get it verified, but still not verified by 150

## 2015-04-11 NOTE — Progress Notes (Signed)
Recreation Therapy Notes  Date: 06.22.16 Time: 3:00 pm Location: Craft Room  Group Topic: Self-esteem  Goal Area(s) Addresses:  Patient will be able to identify benefit of self-esteem. Patient will be able to identify ways to increase self-esteem.  Behavioral Response: Attentive, Interactive  Intervention: Self-Portrait  Activity: Patients were instructed to draw their self-portrait, write something positive about themselves and their peers, and draw their self-portrait after they read the positive things peers wrote.   Education:LRT educated patients on ways to increase their self-esteem.   Education Outcome: Acknowledges education/In group clarification offered  Clinical Observations/Feedback: Patient drew both self-portraits, wrote a positive trait about himself and his peers. Patient did not contribute to group discussion.  Jacquelynn Cree, LRT/CTRS 04/11/2015 4:40 PM

## 2015-04-11 NOTE — [facility] Group Notes (Signed)
[facility] Group Notes:  (Nursing/MHT/Case Management/Adjunct)  Date:  04/11/2015  Time:  12:16 PM  Type of Therapy:  Psychoeducational Skills  Participation Level:  None  Participation Quality:  Inattentive  Affect:  Flat  Cognitive:  Lacking  Insight:  Lacking  Engagement in Group:  None and Off Topic  Modes of Intervention:  Support  Summary of Progress/Problems: Pt was masturbating during group and was told to stop and pt stopped   Marquette Old 04/11/2015, 12:16 PM
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
## 2015-04-11 NOTE — Plan of Care (Signed)
Problem: Alteration in thought process Goal: STG-Patient does not respond to command hallucinations Outcome: Progressing Pt not responding to voices

## 2015-04-11 NOTE — Plan of Care (Signed)
Problem: Alteration in thought process Goal: STG-Patient is able to follow short directions Pt is redirectable on the unit

## 2015-04-11 NOTE — Progress Notes (Signed)
D: Pt denies SI/HI/AVH. Pt is pleasant and cooperative. Pt stated he was trying to get better with his little problem. Pt stated he wanted to go to an "[employer]" , Because that would be like something similar to "[facility]". Writer tried to ground pt in reality. Pt has not gotten much sleep during the night. Pt up in room reading and talking to someone not seen.   A: Pt was offered support and encouragement. Pt was given scheduled medications. Pt was encourage to attend groups. Q 15 minute checks were done for safety. Dr. Jeanene Erb and she wanted to give him 5 mg Ambien to help him get some rest.   R:Pt attends groups and interacts well with peers and staff. Pt is taking medication. Pt has no complaints at this time .Pt receptive to treatment and safety maintained on unit. Pt did fall to sleep.

## 2015-04-11 NOTE — Progress Notes (Signed)
D: Pt denies SI/HI/AVH. Pt is pleasant and cooperative. Pt stated he was doing better with his problem, although pt was noted masturbating in group and with the walls.  A: Pt was offered support and encouragement. Pt was given scheduled medications. Pt was encourage to attend groups. Q 15 minute checks were done for safety. Redirected pt when in group and hallways.   R:Pt attends groups and interacts well with peers and staff. Noted laughing loudly with peers.  Pt is taking medication. Pt has no complaints at this time .Pt receptive to treatment and safety maintained on unit. When redirected, pt stopped immediately

## 2015-04-12 MED ORDER — ZOLPIDEM TARTRATE 5 MG PO TABS
10.0000 mg | ORAL_TABLET | Freq: Every day | ORAL | Status: DC
  Administered 2015-04-12 – 2015-04-17 (×6): 10 mg via ORAL
  Filled 2015-04-12 (×6): qty 2

## 2015-04-12 NOTE — Plan of Care (Signed)
Problem: Alteration in thought process Goal: STG-Patient is able to follow short directions Outcome: Progressing Alert and oriented x 4, no bizarre behavior noted, follows simple command.  Goal: STG-Patient does not respond to command hallucinations Outcome: Progressing Not responding to internal stimuli

## 2015-04-12 NOTE — [facility] Group Notes (Signed)
[facility] Group Notes:  (Nursing/MHT/Case Management/Adjunct)  Date:  04/12/2015  Time:  4:27 AM  Type of Therapy:  Psychoeducational Skills  Participation Level:  Did Not Attend  Foy Guadalajara 04/12/2015, 4:27 AM

## 2015-04-12 NOTE — Plan of Care (Signed)
Problem: Ineffective individual coping Goal: STG: Pt will be able to identify effective and ineffective STG: Pt will be able to identify effective and ineffective coping patterns  Outcome: Progressing Patient remains safe during shift, no inappropriate behavior noted or observed

## 2015-04-12 NOTE — [facility] Group Notes (Signed)
[facility] Group Notes:  (Nursing/MHT/Case Management/Adjunct)  Date:  04/12/2015  Time:  9:18 AM  Type of Therapy:  Community Meeting   Participation Level:  Did Not Attend  Summary of Progress/Problems:  Monigue Spraggins De'Chelle Kaylyn Garrow 04/12/2015, 9:18 AM

## 2015-04-12 NOTE — [facility] Group Notes (Signed)
[facility] Group Notes:  (Nursing/MHT/Case Management/Adjunct)  Date:  04/12/2015  Time:  3:53 PM  Type of Therapy:  Movement Therapy  Participation Level:  Active  Participation Quality:  Appropriate and Attentive  Affect:  Appropriate  Cognitive:  Alert, Appropriate and Oriented  Insight:  Appropriate  Engagement in Group:  Engaged  Modes of Intervention:  Activity  Summary of Progress/Problems:  Ryan Colon 04/12/2015, 3:53 PM

## 2015-04-12 NOTE — Progress Notes (Signed)
Patient observed in room resting, responded spontaneously to knock and announcement, anticipated of care and expected appropriate behaviors discussed. Questions encouraged, explanation provided.
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
## 2015-04-12 NOTE — Progress Notes (Signed)
D: Pt denies SI/HI/AVH. Pt is pleasant and cooperative. Pt stated he is less apprehensive, he appears less anxious and he is interacting with peers and staff appropriately.  A: Pt was offered support and encouragement. Pt was given scheduled medications. Pt was encouraged to attend groups. Q 15 minute checks were done for safety.  R:Pt attends groups and interacts well with peers and staff. Pt is taking medication. Pt has no complaints.Pt receptive to treatment and safety maintained on unit.

## 2015-04-12 NOTE — Progress Notes (Signed)
Recreation Therapy Notes  Date: 06.23.16 Time: 3:00 pm Location: Craft Room  Group Topic: Leisure Education  Goal Area(s) Addresses:  Patient will identify one positive leisure activity.  Behavioral Response: Attentive  Intervention: Leisure Time  Activity: Patients were instructed to write down one positive leisure activity. Patients were instructed to completed "Leisure Time Clock" worksheet. Patients were instructed to list 10 positive emotions as a group. Patients were instructed to match the leisure activities with the emotions.   Education: LRT educated patient on what was needed to participate in leisure.   Education Outcome: Acknowledges education/In group clarification offered  Clinical Observations/Feedback: Patient wrote one positive leisure activity and completed the "Leisure Time Clock" worksheet. Patient did not contribute to group discussion.  Jacquelynn Cree, LRT/CTRS 04/12/2015 4:39 PM

## 2015-04-13 MED ORDER — PALIPERIDONE PALMITATE 156 MG/ML IM SUSP: 156.0000 mg | Freq: Once | INTRAMUSCULAR | Status: DC

## 2015-04-13 MED ORDER — PALIPERIDONE PALMITATE 234 MG/1.5ML IM SUSP
234.0000 mg | Freq: Once | INTRAMUSCULAR | Status: AC
  Administered 2015-04-13: 234 mg via INTRAMUSCULAR
  Filled 2015-04-13: qty 1.5

## 2015-04-13 NOTE — Progress Notes (Signed)
D: Pt is awake and active in the milieu this evening. Pt mood is appropriate and his affect is flat. Pt is watching TV and interacting appropriately with staff and peers, although he forwards little to staff.  A: Writer administered medications as ordered and encouraged compliance with tx regimin.   R: Pt is receptive to staff input and is pleasant and cooperative with staff.

## 2015-04-13 NOTE — [facility] Group Notes (Signed)
[facility] LCSW Group Therapy  04/13/2015 4:06 PM  Type of Therapy:  Group Therapy  Participation Level:  Minimal  Participation Quality:  Attentive  Affect:  Flat  Cognitive:  Alert  Insight:  Limited  Engagement in Therapy:  Limited  Modes of Intervention:  Discussion, Education, Problem-solving, Socialization and Support  Summary of Progress/Problems:Feelings around relapse and recovery: Pt will discuss emotions they experience before and after a relapse. Pt will be encouraged to explore feelings around recovery.  Chancellor stated he was feeling better today because he has been more socially appropriate. He attended group and stayed the entire time. He sat quietly and listened to others. He was appropriate in group today.    [employer] MSW, LCSWA 04/13/2015, 4:06 PM

## 2015-04-13 NOTE — Progress Notes (Signed)
Pleasant and cooperative.  Smiles readily and ways to staff every time he passes the nurses station.  Attending groups.  Interacting with peers appropriately.  No inappropriate sexual behavior noted.  Received first invega injection today.   Safety maintained.

## 2015-04-13 NOTE — [facility] Group Notes (Signed)
[facility] LCSW Aftercare Discharge Planning Group Note  04/13/2015 10:59 AM  Participation Quality:  Appropriate and Attentive  Affect:  Appropriate  Cognitive:  Alert  Insight:  Lacking  Engagement in Group:  Lacking  Modes of Intervention:  Socialization and Support  Summary of Progress/Problems: Patient attended group but pariticipated minimally. Patient shared that his SMART goal is to "come up with better goals.....".   Beryl Meager T 04/13/2015, 10:59 AM

## 2015-04-13 NOTE — [facility] Group Notes (Signed)
[facility] Group Notes:  (Nursing/MHT/Case Management/Adjunct)  Date:  04/13/2015  Time:  4:22 AM  Type of Therapy:  Group Therapy  Participation Level:  Active  Participation Quality:  Appropriate  Affect:  Appropriate  Cognitive:  Appropriate  Insight:  Good and Improving  Engagement in Group:  Developing/Improving and Engaged  Modes of Intervention:  n/a  Summary of Progress/Problems:  Ryan Colon 04/13/2015, 4:22 AM
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
## 2015-04-13 NOTE — Progress Notes (Signed)
Recreation Therapy Notes  Date: 06.24.16 Time: 3:00 pm Location: Craft Room  Group Topic: Problem Solving, Communication, Teamwork  Goal Area(s) Addresses:  Patient will work in teams towards shared goal. Patient will verbalize skills needed to make activity successful. Patient will verbalize benefit of using skills identified to reach post d/c goals.  Behavioral Response: Attentive, Interactive  Intervention: Landing Pad  Activity: Patients were given 15 straws and approximately 2.5 feet of tape and instructed to build a landing pad to catch a golf ball.  Education: LRT educated patients on why communication, teamwork, and problem solving are important.  Education Outcome: Acknowledges education/In group clarification offered   Clinical Observations/Feedback: Patient worked with team to build landing pad. Patient used effective teamwork, problem solving, and communication. Patient contributed to group discussion by stating what skills he used during group and what prevents him from using these skills at home.  Jacquelynn Cree, LRT/CTRS 04/13/2015 5:14 PM

## 2015-04-13 NOTE — Plan of Care (Signed)
Problem: Alteration in thought process Goal: LTG-Patient has not harmed self or others in at least 2 days Outcome: Progressing No self harm     

## 2015-04-13 NOTE — Plan of Care (Signed)
Problem: Alteration in thought process Goal: STG-Patient is able to follow short directions Outcome: Progressing Easily redirected

## 2015-04-13 NOTE — [facility] Group Notes (Signed)
[facility] Group Notes:  (Nursing/MHT/Case Management/Adjunct)  Date:  04/13/2015  Time:  11:39 PM  Type of Therapy:  Group Therapy  Participation Level:  Active  Participation Quality:  Appropriate and Attentive  Affect:  Appropriate  Cognitive:  Alert and Appropriate  Insight:  Appropriate  Engagement in Group:  Engaged  Modes of Intervention:  Discussion  Summary of Progress/Problems:  Chela Sutphen Joy Donnice Nielsen 04/13/2015, 11:39 PM

## 2015-04-13 NOTE — [facility] Group Notes (Signed)
[facility] Group Notes:  (Nursing/MHT/Case Management/Adjunct)  Date:  04/13/2015  Time:  11:48 AM  Type of Therapy:  Group Therapy  Participation Level:  Minimal  Participation Quality:  Appropriate and Redirectable  Affect:  Flat  Cognitive:  Disorganized and Confused  Insight:  Lacking  Engagement in Group:  Lacking  Modes of Intervention:  Activity  Summary of Progress/Problems:  Annaleah Arata De'Chelle Hagen Bohorquez 04/13/2015, 11:48 AM

## 2015-04-14 DIAGNOSIS — F203 Undifferentiated schizophrenia: Secondary | ICD-10-CM

## 2015-04-14 NOTE — [facility] Group Notes (Signed)
[facility] LCSW Group Therapy  04/14/2015 3:27 PM  Type of Therapy:  Group Therapy  Participation Level:  Did Not Attend  Modes of Intervention:  Discussion, Education, Socialization and Support  Summary of Progress/Problems:Pt will identify unhealthy thoughts and how they impact their emotions and behavior. Pt will be encouraged to discuss these thoughts, emotions and behaviors with the group. Pt will be asked to pick a positive affirmation and discuss what emotion they experienced.    [employer] MSW, LCSWA  04/14/2015, 3:27 PM

## 2015-04-14 NOTE — [facility] Group Notes (Signed)
[facility] Group Notes:  (Nursing/MHT/Case Management/Adjunct)  Date:  04/14/2015  Time:  2:28 PM  Type of Therapy:  Group Therapy  Participation Level:  Active  Participation Quality:  Appropriate, Attentive and Sharing  Affect:  Appropriate  Cognitive:  Alert, Appropriate and Oriented  Insight:  Appropriate  Engagement in Group:  Engaged  Modes of Intervention:  Problem-solving  Summary of Progress/Problems:  Ryan Colon 04/14/2015, 2:28 PM

## 2015-04-14 NOTE — Plan of Care (Signed)
Problem: Alteration in thought process Goal: LTG-Patient behavior demonstrates decreased signs psychosis (Patient behavior demonstrates decreased signs of psychosis to the point the patient is safe to return home and continue treatment in an outpatient setting.)  Outcome: Progressing Patient is alert and oriented x 4, speech is coherent, thoughts are organized denies SI/HI/AVH.

## 2015-04-14 NOTE — Progress Notes (Signed)
Alert and oriented.  No inappropriate behavior.  Verbalized that he has been sleeping a lot today and feels that the invega injection that he received yesterday has been contributing to that.  Asking when he can go home.  Informed that we would probably know more on Monday when his doctor returned.  Smiles readily.  Noted in his room at one point journaling. Safe environment maintained.
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
## 2015-04-14 NOTE — Plan of Care (Signed)
Problem: Alteration in thought process Goal: LTG-Patient behavior demonstrates decreased signs psychosis (Patient behavior demonstrates decreased signs of psychosis to the point the patient is safe to return home and continue treatment in an outpatient setting.)  Outcome: Progressing Alert and oriented.  No signs of responding to any internal stimuli. No inappropriate behavior noted

## 2015-04-14 NOTE — Plan of Care (Signed)
Problem: Alteration in thought process Goal: LTG-Patient is able to perceive the environment accurately Outcome: Progressing No signs of confusion

## 2015-04-14 NOTE — Progress Notes (Signed)
D: Pt denies SI/HI/AVH. Pt is pleasant and cooperative. Pt stated " he feels better from attending group and taking his medication" He appears less anxious and he is interacting with peers and staff appropriately.  A: Pt was offered support and encouragement. Pt was given scheduled medications. Pt was encouraged to attend groups. Q 15 minute checks were done for safety.  R:Pt attends groups and interacts well with peers and staff. Pt is taking medication. Pt has no complaints.Pt receptive to treatment and safety maintained on unit.

## 2015-04-15 NOTE — Progress Notes (Signed)
In TV room interacting with peers at onset of shift. Was medication compliant and denies AVH, SI, HI. Retreated to room at 2200 for night sleep.

## 2015-04-15 NOTE — Progress Notes (Signed)
Patient spending time in his room. He says his mood is "fine." Denies SI. No evidence of gross psychotic thinking. Noticed to be masturbating on rounds. Patient not interested in attending groups. He needs continued education on importance of medication compliance and importance of complying with treatment. Will continue to monitor mood, mental status, behavior, maintain safety checks.

## 2015-04-15 NOTE — [facility] Group Notes (Signed)
[facility] LCSW Group Therapy  04/15/2015 3:51 PM  Type of Therapy:  Group Therapy  Participation Level:  Minimal  Participation Quality:  Attentive  Affect:  Flat  Cognitive:  Disorganized  Insight:  Limited  Engagement in Therapy:  Limited  Modes of Intervention:  Education, Exploration, Role-play, Socialization and Support  Summary of Progress/Problems:Healthy Communication: Pt will discuss the importance of communication. They will be encouraged to share examples of unhealthy communication they have experienced. Also, they will be encouraged to provide strategies for healthy communication.  Ryan Colon attended group briefly. He reports his mind is "clearing up" and that his memory is improving. When asked to provide and example of unhealthy communication he stated "affair." He was unable to elaborate. During group he would laugh inappropriately.   Daisy Floro Allean Montfort MSW, LCSWA  04/15/2015, 3:51 PM
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
## 2015-04-15 NOTE — Progress Notes (Deleted)
Patient ID: Ryan Colon, male   DOB: May 10, 1991, 24 y.o.   MRN: [id number]  CSW attempted to complete assessment. Pt unable to participate due to current presentation.   [place], MSW, [place]  04/15/2015 1:01 PM

## 2015-04-16 NOTE — Progress Notes (Signed)
D: Patient denies SI/HI/AVH.   Patient affect is flat and is mood is appropriate.  Patient did attend evening group. Patient visible on the milieu. No distress noted. A: Support and encouragement offered. Scheduled medications given to pt. Q 15 min checks continued for patient safety. R: Patient receptive. Patient remains safe on the unit.
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
## 2015-04-16 NOTE — Progress Notes (Signed)
Patient denies suicidal or homicidal ideation and A/V hallucination.No inappropriate behaviors noted today.Appropriate with staff & peers.Attended groups.He stated that he is getting claustrophobic & wants to get out of here.

## 2015-04-16 NOTE — Progress Notes (Signed)
Recreation Therapy Notes  Date: 06.27.16 Time: 3:00 pm Location: Craft Room  Group Topic: Wellness  Goal Area(s) Addresses:  Patient will identify at least one item per dimension of health. Patient will examine areas they are deficient.  Behavioral Response: Attentive  Intervention: 6 Dimensions of Health  Activity: Patients were given a worksheet with the definitions of the 6 dimensions of health and instructed to read the worksheet. Patients were given a worksheet with the 6 dimensions on it and instructed to list at least one thing they are currently doing in each category.  Education: LRT educated patient on the 6 dimensions of health   Education Outcome: In group clarification offered  Clinical Observations/Feedback: Patient completed activity by listing at least one thing in each category. Patient did not contribute to group discussion.  Jacquelynn Cree, LRT/CTRS 04/16/2015 4:40 PM

## 2015-04-16 NOTE — [facility] Group Notes (Signed)
[facility] Group Notes:  (Nursing/MHT/Case Management/Adjunct)  Date:  04/16/2015  Time:  5:05 AM  Type of Therapy:  Evening Wrap-up Group/Outside  Participation Level:  Active  Participation Quality:  Appropriate  Affect:  Appropriate  Cognitive:  Appropriate  Insight:  Improving  Engagement in Group:  Engaged  Modes of Intervention:  Activity  Summary of Progress/Problems:  Ryan Colon 04/16/2015, 5:05 AM

## 2015-04-16 NOTE — Plan of Care (Signed)
Problem: Ineffective individual coping Goal: STG:Pt. will utilize relaxation techniques to reduce stress STG: Patient will utilize relaxation techniques to reduce stress levels  Outcome: Progressing Patient attended group in courtyard for fresh air that makes him feels better.

## 2015-04-17 MED ORDER — PALIPERIDONE ER 9 MG PO TB24: 9.0000 mg | ORAL_TABLET | Freq: Every day | ORAL | Status: DC

## 2015-04-17 MED ORDER — PALIPERIDONE PALMITATE 156 MG/ML IM SUSP: 156.0000 mg | Freq: Once | INTRAMUSCULAR | Status: AC

## 2015-04-17 MED ORDER — PALIPERIDONE PALMITATE 156 MG/ML IM SUSP: 156.0000 mg | Freq: Once | INTRAMUSCULAR | Status: DC

## 2015-04-17 MED ORDER — ZOLPIDEM TARTRATE 10 MG PO TABS: 10.0000 mg | ORAL_TABLET | Freq: Every day | ORAL | Status: AC

## 2015-04-17 MED ORDER — PALIPERIDONE PALMITATE 156 MG/ML IM SUSP
156.0000 mg | Freq: Once | INTRAMUSCULAR | Status: AC
  Administered 2015-04-18: 156 mg via INTRAMUSCULAR
  Filled 2015-04-17 (×2): qty 1

## 2015-04-17 MED ORDER — PALIPERIDONE ER 9 MG PO TB24: 9.0000 mg | ORAL_TABLET | Freq: Every day | ORAL | Status: AC

## 2015-04-17 NOTE — Plan of Care (Signed)
Problem: Ineffective individual coping Goal: LTG: Patient will report a decrease in negative feelings Outcome: Progressing Denies SI/HI interacting appropriately.

## 2015-04-17 NOTE — Progress Notes (Signed)
D: Patient denies SI/HI/AVH.  Patient affect is appropriate. Mood is pleasant.  Patient did attend evening group. Patient visible on the milieu. No distress noted. A: Support and encouragement offered. Scheduled medications given to pt. Q 15 min checks continued for patient safety. R: Patient receptive. Patient remains safe on the unit.
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
## 2015-04-17 NOTE — Progress Notes (Signed)
Patient denies SI/HI/AVH. Patient affect is appropriate. Mood is pleasant. Group compliant. Patient visible on the milieu. No distress noted. A: Support and encouragement offered. Scheduled medications given to pt. Q 15 min checks continued for patient safety. R: Patient receptive. Patient remains safe on the unit.

## 2015-04-17 NOTE — [facility] Group Notes (Signed)
[facility] Group Notes:  (Nursing/MHT/Case Management/Adjunct)  Date:  04/17/2015  Time:  2:12 PM  Type of Therapy:  Psychoeducational Skills  Participation Level:  Active  Participation Quality:  Attentive  Affect:  Not Congruent  Cognitive:  Alert and Appropriate  Insight:  Appropriate  Engagement in Group:  Lacking  Modes of Intervention:  Discussion and Education  Summary of Progress/Problems:  Lynelle SmokeCara Travis [facility] 04/17/2015, 2:12 PM

## 2015-04-17 NOTE — BHH Group Notes (Signed)
[facility] Group Notes:  (Nursing/MHT/Case Management/Adjunct)  Date:  04/17/2015  Time:  12:21 AM  Type of Therapy:  wrap up group  Participation Level:  Active  Participation Quality:  Appropriate  Affect:  Appropriate  Cognitive:  Appropriate  Insight:  Appropriate  Engagement in Group:  Engaged  Modes of Intervention:  Activity  Summary of Progress/Problems:  Burt EkJanice Marie Bonifacio Pruden 04/17/2015, 12:21 AM

## 2015-04-17 NOTE — Plan of Care (Signed)
Problem: Ineffective individual coping Goal: LTG: Patient will report a decrease in negative feelings Outcome: Progressing No negative feelings voiced. Pt smiling and pleasant

## 2015-04-17 NOTE — Progress Notes (Signed)
Recreation Therapy Notes  Date: 06.28.16 Time: 3:00 pm Location: Craft Room  Group Topic: Problem Solving, Teamwork, Communication  Goal Area(s) Addresses:  Patient will effectively work with peer towards shared goal. Patient will identify skills used to make activity successful. Patient will identify benefit of using group skills effectively post d/c.  Behavioral Response: Arrived late  Intervention: [employer] Tower  Activity: Patients were instructed to build a free standing tower with 15 pipe cleaners. Patients were given 2 minutes to strategize. After patients had been building for approximately 5 minutes, LRT instructed patients to put their dominate hand behind their back. After approximately another 5 minutes of building, LRT instructed patients that they could not talk to each other.  Education:LRT educated patient on why communication, problem solving, and teamwork is important.   Education Outcome: In group clarification offered   Clinical Observations/Feedback: Patient arrived to group at approximately 3:30 pm. Patient did not contribute to group discussion. Patient had his head on the table for most of the discussion.  Jacquelynn CreeGreene,[name], LRT/CTRS 04/17/2015 4:30 PM

## 2015-04-18 NOTE — Progress Notes (Signed)
.  D: Pt denies SI/HI/AVH. Pt is pleasant and cooperative. Pt stated "he is very happy and excited that he is been discharged tomorrow. He appears less anxious and he is interacting with peers and staff appropriately.  A: Pt was offered support and encouragement. Pt was given scheduled medications. Pt was encouraged to attend groups. Q 15 minute checks were done for safety.  R:Pt attends groups and interacts well with peers and staff. Pt is taking medication. Pt has no complaints.Pt receptive to treatment and safety maintained on unit.

## 2015-04-18 NOTE — Progress Notes (Signed)
Patient's Father arrives to transport patient home. Review of meds and recommended discharge plan of care and understanding verbalized. Patient and Father able to speak with LCSW Candace for additional questions. No SI/HI/AVH at this time. Belongings returned with patient review belongings. Safety maintained.

## 2015-04-18 NOTE — Progress Notes (Signed)
AVS H&P faxed to [facility] for hospital follow-up

## 2015-04-18 NOTE — [facility] Suicide Risk Assessment (Signed)
[facility] Suicide Risk Assessment   Demographic Factors:  Male, Adolescent or young adult, Low socioeconomic status and Unemployed  Total Time spent with patient: 30 minutes     Psychiatric Specialty Exam: Physical Exam  ROS                                                         Have you used any form of tobacco in the last 30 days? (Cigarettes, Smokeless Tobacco, Cigars, and/or Pipes): Yes  Has this patient used any form of tobacco in the last 30 days? (Cigarettes, Smokeless Tobacco, Cigars, and/or Pipes) Yes, A prescription for an FDA-approved tobacco cessation medication was offered at discharge and the patient refused  Mental Status Per Nursing Assessment::   On Admission:     Current Mental Status by Physician: denies SI, HI or A/Vh.  Hopeful and future oriented. No evidence of psychosis or delusional thinking.  No agitation or anxiety.  Loss Factors: Decrease in vocational status and Financial problems/change in socioeconomic status  Historical Factors: Family history of mental illness or substance abuse  Risk Reduction Factors:   Sense of responsibility to family, Living with another person, especially a relative and Positive social support  Continued Clinical Symptoms:  Alcohol/Substance Abuse/Dependencies Schizophrenia:   Paranoid or undifferentiated type More than one psychiatric diagnosis  Cognitive Features That Contribute To Risk:  None    Suicide Risk:  Minimal: No identifiable suicidal ideation.  Patients presenting with no risk factors but with morbid ruminations; may be classified as minimal risk based on the severity of the depressive symptoms  Principal Problem: Undifferentiated schizophrenia Discharge Diagnoses:  Patient Active Problem List   Diagnosis Date Noted  . Undifferentiated schizophrenia [F20.3] 04/06/2015  . Cannabis use disorder, severe, dependence [F12.20] 04/02/2015  . Tobacco use disorder [Z72.0]  04/02/2015    Follow-up Information    Follow up with [facility]. Go on 04/26/2015.   Why:  12:30pm , Hospital Follow up, Outpatient Medication Management, Please reschedule in advance unable to make this appointment.   Contact information:   [address] [place], [id number] [phone] Fax-[phone]      Plan Of Care/Follow-up recommendations:  Other:  [facility]. Continue invega injection  Is patient on multiple antipsychotic therapies at discharge:  No   Has Patient had three or more failed trials of antipsychotic monotherapy by history:  No  Recommended Plan for Multiple Antipsychotic Therapies: NA    Jimmy FootmanHernandez-Gonzalez,  Ryan Colon 04/18/2015, 9:02 AM

## 2015-04-18 NOTE — Progress Notes (Signed)
Patient with appropriate affect and cooperative behavior with meals and plan of care. No SI/HI/AVH at this time. Good appetite and good adls. Quiet with peers this morning and verbalizes needs appropriately with staff. Meets with MD this am and patient to discharge when transportation arrives. Patient refuses Quit Smoking education at this time, flyer given. Patient states "I seemed to have quit already".
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
## 2015-04-18 NOTE — Progress Notes (Signed)
  [facility]  Will you be returning to the same living situation after discharge:  No. At discharge, do you have transportation home?: Yes,  Father  Do you have the ability to pay for your medications: Yes,  Mental health   Release of information consent forms completed and in the chart;  Patient's signature needed at discharge.  Patient to Follow up at: Follow-up Information    Follow up with [facility]. Go on 04/26/2015.   Why:  12:30pm , Hospital Follow up, Outpatient Medication Management, Please reschedule in advance unable to make this appointment.   Contact information:   [address] [place] , [id number] [phone] Fax-[phone]      Patient denies SI/HI: Yes,  Yes    Safety Planning and Suicide Prevention discussed: Yes,  With patient and father   Have you used any form of tobacco in the last 30 days? (Cigarettes, Smokeless Tobacco, Cigars, and/or Pipes): Yes  Has patient been referred to the Quitline?: Patient refused referral  Sempra EnergyCandace L Lottie Sigman MSW, LCSWA   04/18/2015, 10:28 AM

## 2015-05-29 ENCOUNTER — Emergency Department
Admission: EM | Admit: 2015-05-29 | Discharge: 2015-05-29 | Disposition: A | Discharge status: Home / Self Care | Payer: Medicaid Other | Attending: Emergency Medicine | Admitting: Emergency Medicine

## 2015-05-29 ENCOUNTER — Encounter: Payer: Self-pay | Admitting: Emergency Medicine

## 2015-05-29 ENCOUNTER — Emergency Department: Payer: Medicaid Other

## 2015-05-29 DIAGNOSIS — R51 Headache: Secondary | ICD-10-CM | POA: Diagnosis not present

## 2015-05-29 DIAGNOSIS — R112 Nausea with vomiting, unspecified: Secondary | ICD-10-CM | POA: Diagnosis present

## 2015-05-29 DIAGNOSIS — R519 Headache, unspecified: Secondary | ICD-10-CM

## 2015-05-29 DIAGNOSIS — R42 Dizziness and giddiness: Secondary | ICD-10-CM | POA: Diagnosis not present

## 2015-05-29 DIAGNOSIS — Z720 Tobacco use: Secondary | ICD-10-CM | POA: Insufficient documentation

## 2015-05-29 DIAGNOSIS — Z79899 Other long term (current) drug therapy: Secondary | ICD-10-CM | POA: Diagnosis not present

## 2015-05-29 HISTORY — DX: Insomnia, unspecified: G47.00

## 2015-05-29 HISTORY — DX: Schizophrenia, unspecified: F20.9

## 2015-05-29 LAB — COMPREHENSIVE METABOLIC PANEL
ALBUMIN: 4.4 g/dL (ref 3.5–5.0)
ALK PHOS: 58 U/L (ref 38–126)
ALT: 37 U/L (ref 17–63)
ANION GAP: 9 (ref 5–15)
AST: 45 U/L — AB (ref 15–41)
BUN: 12 mg/dL (ref 6–20)
CO2: 28 mmol/L (ref 22–32)
Calcium: 9.7 mg/dL (ref 8.9–10.3)
Chloride: 102 mmol/L (ref 101–111)
Creatinine, Ser: 1.17 mg/dL (ref 0.61–1.24)
Glucose, Bld: 114 mg/dL — ABNORMAL HIGH (ref 65–99)
POTASSIUM: 3.7 mmol/L (ref 3.5–5.1)
Sodium: 139 mmol/L (ref 135–145)
Total Bilirubin: 0.8 mg/dL (ref 0.3–1.2)
Total Protein: 8.3 g/dL — ABNORMAL HIGH (ref 6.5–8.1)

## 2015-05-29 LAB — LIPASE, BLOOD: LIPASE: 19 U/L — AB (ref 22–51)

## 2015-05-29 LAB — CBC WITH DIFFERENTIAL/PLATELET
BASOS ABS: 0 10*3/uL (ref 0–0.1)
BASOS PCT: 1 %
Eosinophils Absolute: 0.1 10*3/uL (ref 0–0.7)
Eosinophils Relative: 1 %
HCT: 41.6 % (ref 40.0–52.0)
HEMOGLOBIN: 13.9 g/dL (ref 13.0–18.0)
Lymphocytes Relative: 22 %
Lymphs Abs: 1.4 10*3/uL (ref 1.0–3.6)
MCH: 29.5 pg (ref 26.0–34.0)
MCHC: 33.3 g/dL (ref 32.0–36.0)
MCV: 88.5 fL (ref 80.0–100.0)
Monocytes Absolute: 0.7 10*3/uL (ref 0.2–1.0)
Monocytes Relative: 10 %
Neutro Abs: 4.2 10*3/uL (ref 1.4–6.5)
Neutrophils Relative %: 66 %
Platelets: 319 10*3/uL (ref 150–440)
RBC: 4.7 MIL/uL (ref 4.40–5.90)
RDW: 12.9 % (ref 11.5–14.5)
WBC: 6.3 10*3/uL (ref 3.8–10.6)

## 2015-05-29 LAB — URINALYSIS COMPLETE WITH MICROSCOPIC (ARMC ONLY)
Bacteria, UA: NONE SEEN
Bilirubin Urine: NEGATIVE
GLUCOSE, UA: NEGATIVE mg/dL
HGB URINE DIPSTICK: NEGATIVE
Ketones, ur: NEGATIVE mg/dL
Leukocytes, UA: NEGATIVE
Nitrite: NEGATIVE
PH: 6 (ref 5.0–8.0)
Protein, ur: NEGATIVE mg/dL
Specific Gravity, Urine: 1.016 (ref 1.005–1.030)
Squamous Epithelial / LPF: NONE SEEN

## 2015-05-29 MED ORDER — ONDANSETRON 4 MG PO TBDP: 4.0000 mg | ORAL_TABLET | Freq: Three times a day (TID) | ORAL | Status: AC | PRN

## 2015-05-29 MED ORDER — ONDANSETRON 4 MG PO TBDP
4.0000 mg | ORAL_TABLET | Freq: Once | ORAL | Status: AC
  Administered 2015-05-29: 4 mg via ORAL
  Filled 2015-05-29: qty 1

## 2015-05-29 MED ORDER — ACETAMINOPHEN 325 MG PO TABS
650.0000 mg | ORAL_TABLET | Freq: Once | ORAL | Status: AC
  Administered 2015-05-29: 650 mg via ORAL
  Filled 2015-05-29: qty 2

## 2015-05-29 NOTE — ED Notes (Addendum)
Pt presents to ED with projectile vomiting for the past 4-5 days and dizziness upon standing. Pt states he got an Abilify injection about 8 days ago and thinks he may be having an allergic reaction. Vomiting occurs mostly when attempting to eat. Denies sob. Last time vomiting was approx 30 min ago. Similar reaction to Invega injection recently. Pt with flat affect. Alert and appears calm. No increased work of breathing or acute distress noted at this time. Ambulatory with a steady gait.

## 2015-05-29 NOTE — ED Provider Notes (Signed)
[facility] Emergency Department Provider Note  ____________________________________________  Time seen: Approximately 425 AM  I have reviewed the triage vital signs and the nursing notes.   HISTORY  Chief Complaint Vomiting; Allergic Reaction; and Dizziness    HPI Ryan Colon is a 24 y.o. male who received a shot of Abilify approximately one week ago. The patient reports that since then he has not been feeling well. He's had nausea and vomiting for 4 days. He reports that he's also had some intermittent headache. He had symptoms when he took in [place]. He reports that the doctor told him if he had these symptoms with the Abilify he should come in to the hospital. The patient reports that he has been unable to keep anything down. He reports that when he stands it hurts in the back of his head. He reports that this also happened with the. The patient reports that he last vomited the last time he tried to eat something. He denies any abdominal pain or diarrhea. His headache is 8 out of 10 in intensity. He reports that he thinks that there something wrong with his brain so he came in for evaluation.He has not been taking anything for pain.   Past Medical History  Diagnosis Date  . Schizophrenia   . Insomnia     Patient Active Problem List   Diagnosis Date Noted  . 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 Cannabis use disorder, severe, dependence 04/02/2015  . Tobacco use disorder 04/02/2015    History reviewed. No pertinent past surgical history.  Current Outpatient Rx  Name  Route  Sig  Dispense  Refill  . ARIPiprazole (ABILIFY) 9.75 MG/1.3ML injection   Intramuscular   Inject into the muscle every 30 (thirty) days.         . ondansetron (ZOFRAN ODT) 4 MG disintegrating tablet   Oral   Take 1 tablet (4 mg total) by mouth every 8 (eight) hours as needed for nausea or vomiting.   20 tablet   0   . paliperidone (INVEGA SUSTENNA) 156 MG/ML SUSP  injection   Intramuscular   Inject 1 mL (156 mg total) into the muscle once.   0.9 mL   0     Due on July 21   . paliperidone (INVEGA) 9 MG 24 hr tablet   Oral   Take 1 tablet (9 mg total) by mouth at bedtime.   7 tablet   0   . zolpidem (AMBIEN) 10 MG tablet   Oral   Take 1 tablet (10 mg total) by mouth at bedtime.   15 tablet   0     Allergies Abilify and Invega  History reviewed. No pertinent family history.  Social History History  Substance Use Topics  . Smoking status: Current Every Day Smoker -- 0.50 packs/day for 4 years    Types: Cigarettes  . Smokeless tobacco: Never Used  . Alcohol Use: Yes    Review of Systems Constitutional: No fever/chills Eyes: No visual changes. ENT: No sore throat. Cardiovascular: Denies chest pain. Respiratory: Denies shortness of breath. Gastrointestinal: Nausea and vomiting No abdominal pain.  No diarrhea.  No constipation. Genitourinary: Negative for dysuria. Musculoskeletal: Negative for back pain. Skin: Negative for rash. Neurological: Headache  10-point ROS otherwise negative.  ____________________________________________   PHYSICAL EXAM:  VITAL SIGNS: ED Triage Vitals  Enc Vitals Group     BP 05/29/15 0110 126/83 mmHg     Pulse Rate 05/29/15 0110 108  Resp 05/29/15 0110 18     Temp 05/29/15 0110 98.2 F (36.8 C)     Temp Source 05/29/15 0110 Oral     SpO2 05/29/15 0110 98 %     Weight 05/29/15 0110 185 lb (83.915 kg)     Height 05/29/15 0110  (1.753 m)     Head Cir --      Peak Flow --      Pain Score 05/29/15 0110 0     Pain Loc --      Pain Edu? --      Excl. in GC? --     Constitutional: Alert and oriented. Well appearing and in mild distress. Eyes: Conjunctivae are normal. PERRL. EOMI. Head: Atraumatic. Nose: No congestion/rhinnorhea. Mouth/Throat: Mucous membranes are moist.  Oropharynx non-erythematous. Cardiovascular: Normal rate, regular rhythm. Grossly normal heart sounds.  Good  peripheral circulation. Respiratory: Normal respiratory effort.  No retractions. Lungs CTAB. Gastrointestinal: Soft and nontender. No distention. Positive bowel sounds Musculoskeletal: No lower extremity tenderness nor edema.  No joint effusions. Neurologic:  Normal speech and language. No gross focal neurologic deficits are appreciated. No gait instability. Skin:  Skin is warm, dry and intact. No rash noted. Psychiatric: Mood and affect are normal.   ____________________________________________   LABS (all labs ordered are listed, but only abnormal results are displayed)  Labs Reviewed  COMPREHENSIVE METABOLIC PANEL - Abnormal; Notable for the following:    Glucose, Bld 114 (*)    Total Protein 8.3 (*)    AST 45 (*)    All other components within normal limits  LIPASE, BLOOD - Abnormal; Notable for the following:    Lipase 19 (*)    All other components within normal limits  URINALYSIS COMPLETEWITH MICROSCOPIC ([facility] ONLY) - Abnormal; Notable for the following:    Color, Urine YELLOW (*)    APPearance CLEAR (*)    All other components within normal limits  CBC WITH DIFFERENTIAL/PLATELET   ____________________________________________  EKG  ED ECG REPORT I, Rebecka Apley, the attending physician, personally viewed and interpreted this ECG.   Date: 05/29/2015  EKG Time: 117  Rate: 92  Rhythm: normal sinus rhythm  Axis: normal  Intervals:none  ST&T Change: none  ____________________________________________  RADIOLOGY  CT head: Unremarkable non-con CT of the head. ____________________________________________   PROCEDURES  Procedure(s) performed: None  Critical Care performed: No  ____________________________________________   INITIAL IMPRESSION / ASSESSMENT AND PLAN / ED COURSE  Pertinent labs & imaging results that were available during my care of the patient were reviewed by me and considered in my medical decision making (see chart for  details).  This is a 24 year old male who comes in with multiple symptoms he feels are reaction to his Abilify shot. The patient is breathing comfortably and I did do a CT of his head which does not show any intracranial process. I will discharge the patient to follow-up with his primary care physician. I did give the patient some Zofran and Tylenol as well as have him take some by mouth fluids. The patient has not vomited while in the emergency department. He'll be discharged home. ____________________________________________   FINAL CLINICAL IMPRESSION(S) / ED DIAGNOSES  Final diagnoses:  Non-intractable vomiting with nausea, vomiting of unspecified type  Nonintractable episodic headache, unspecified headache type  Dizziness      Rebecka Apley, MD 05/29/15 [phone]

## 2015-05-29 NOTE — ED Notes (Signed)
Pt resting in darkened room , awakens easily with verbal stimuli, complaining of 8/10 headache, pt easily returns to resting with snoring respirations.

## 2015-05-29 NOTE — Discharge Instructions (Signed)
Dizziness °Dizziness is a common problem. It is a feeling of unsteadiness or light-headedness. You may feel like you are about to faint. Dizziness can lead to injury if you stumble or fall. A person of any age group can suffer from dizziness, but dizziness is more common in older adults. °CAUSES  °Dizziness can be caused by many different things, including: °· Middle ear problems. °· Standing for too long. °· Infections. °· An allergic reaction. °· Aging. °· An emotional response to something, such as the sight of blood. °· Side effects of medicines. °· Tiredness. °· Problems with circulation or blood pressure. °· Excessive use of alcohol or medicines, or illegal drug use. °· Breathing too fast (hyperventilation). °· An irregular heart rhythm (arrhythmia). °· A low red blood cell count (anemia). °· Pregnancy. °· Vomiting, diarrhea, fever, or other illnesses that cause body fluid loss (dehydration). °· Diseases or conditions such as Parkinson's disease, high blood pressure (hypertension), diabetes, and thyroid problems. °· Exposure to extreme heat. °DIAGNOSIS  °Your health care provider will ask about your symptoms, perform a physical exam, and perform an electrocardiogram (ECG) to record the electrical activity of your heart. Your health care provider may also perform other heart or blood tests to determine the cause of your dizziness. These may include: °· Transthoracic echocardiogram (TTE). During echocardiography, sound waves are used to evaluate how blood flows through your heart. °· Transesophageal echocardiogram (TEE). °· Cardiac monitoring. This allows your health care provider to monitor your heart rate and rhythm in real time. °· Holter monitor. This is a portable device that records your heartbeat and can help diagnose heart arrhythmias. It allows your health care provider to track your heart activity for several days if needed. °· Stress tests by exercise or by giving medicine that makes the heart beat  faster. °TREATMENT  °Treatment of dizziness depends on the cause of your symptoms and can vary greatly. °HOME CARE INSTRUCTIONS  °· Drink enough fluids to keep your urine clear or pale yellow. This is especially important in very hot weather. In older adults, it is also important in cold weather. °· Take your medicine exactly as directed if your dizziness is caused by medicines. When taking blood pressure medicines, it is especially important to get up slowly. °¨ Rise slowly from chairs and steady yourself until you feel okay. °¨ In the morning, first sit up on the side of the bed. When you feel okay, stand slowly while holding onto something until you know your balance is fine. °· Move your legs often if you need to stand in one place for a long time. Tighten and relax your muscles in your legs while standing. °· Have someone stay with you for 1-2 days if dizziness continues to be a problem. Do this until you feel you are well enough to stay alone. Have the person call your health care provider if he or she notices changes in you that are concerning. °· Do not drive or use heavy machinery if you feel dizzy. °· Do not drink alcohol. °SEEK IMMEDIATE MEDICAL CARE IF:  °· Your dizziness or light-headedness gets worse. °· You feel nauseous or vomit. °· You have problems talking, walking, or using your arms, hands, or legs. °· You feel weak. °· You are not thinking clearly or you have trouble forming sentences. It may take a friend or family member to notice this. °· You have chest pain, abdominal pain, shortness of breath, or sweating. °· Your vision changes. °· You notice   any bleeding.  You have side effects from medicine that seems to be getting worse rather than better. MAKE SURE YOU:   Understand these instructions.  Will watch your condition.  Will get help right away if you are not doing well or get worse. Document Released: 04/01/2001 Document Revised: 10/11/2013 Document Reviewed: 04/25/2011 [facility]  Patient Information 2015 [employer], [place]. This information is not intended to replace advice given to you by your health care provider. Make sure you discuss any questions you have with your health care provider.  Headaches, Frequently Asked Questions MIGRAINE HEADACHES Q: What is migraine? What causes it? How can I treat it? A: Generally, migraine headaches begin as a dull ache. Then they develop into a constant, throbbing, and pulsating pain. You may experience pain at the temples. You may experience pain at the front or back of one or both sides of the head. The pain is usually accompanied by a combination of:  Nausea.  Vomiting.  Sensitivity to light and noise. Some people (about 15%) experience an aura (see below) before an attack. The cause of migraine is believed to be chemical reactions in the brain. Treatment for migraine may include over-the-counter or prescription medications. It may also include self-help techniques. These include relaxation training and biofeedback.  Q: What is an aura? A: About 15% of people with migraine get an "aura". This is a sign of neurological symptoms that occur before a migraine headache. You may see wavy or jagged lines, dots, or flashing lights. You might experience tunnel vision or blind spots in one or both eyes. The aura can include visual or auditory hallucinations (something imagined). It may include disruptions in smell (such as strange odors), taste or touch. Other symptoms include:  Numbness.  A "pins and needles" sensation.  Difficulty in recalling or speaking the correct word. These neurological events may last as long as 60 minutes. These symptoms will fade as the headache begins. Q: What is a trigger? A: Certain physical or environmental factors can lead to or "trigger" a migraine. These include:  Foods.  Hormonal changes.  Weather.  Stress. It is important to remember that triggers are different for everyone. To help prevent  migraine attacks, you need to figure out which triggers affect you. Keep a headache diary. This is a good way to track triggers. The diary will help you talk to your healthcare professional about your condition. Q: Does weather affect migraines? A: Bright sunshine, hot, humid conditions, and drastic changes in barometric pressure may lead to, or "trigger," a migraine attack in some people. But studies have shown that weather does not act as a trigger for everyone with migraines. Q: What is the link between migraine and hormones? A: Hormones start and regulate many of your body's functions. Hormones keep your body in balance within a constantly changing environment. The levels of hormones in your body are unbalanced at times. Examples are during menstruation, pregnancy, or menopause. That can lead to a migraine attack. In fact, about three quarters of all women with migraine report that their attacks are related to the menstrual cycle.  Q: Is there an increased risk of stroke for migraine sufferers? A: The likelihood of a migraine attack causing a stroke is very remote. That is not to say that migraine sufferers cannot have a stroke associated with their migraines. In persons under age 73, the most common associated factor for stroke is migraine headache. But over the course of a person's normal life span, the occurrence of  migraine headache may actually be associated with a reduced risk of dying from cerebrovascular disease due to stroke.  Q: What are acute medications for migraine? A: Acute medications are used to treat the pain of the headache after it has started. Examples over-the-counter medications, NSAIDs, ergots, and triptans.  Q: What are the triptans? A: Triptans are the newest class of abortive medications. They are specifically targeted to treat migraine. Triptans are vasoconstrictors. They moderate some chemical reactions in the brain. The triptans work on receptors in your brain. Triptans  help to restore the balance of a neurotransmitter called serotonin. Fluctuations in levels of serotonin are thought to be a main cause of migraine.  Q: Are over-the-counter medications for migraine effective? A: Over-the-counter, or "OTC," medications may be effective in relieving mild to moderate pain and associated symptoms of migraine. But you should see your caregiver before beginning any treatment regimen for migraine.  Q: What are preventive medications for migraine? A: Preventive medications for migraine are sometimes referred to as "prophylactic" treatments. They are used to reduce the frequency, severity, and length of migraine attacks. Examples of preventive medications include antiepileptic medications, antidepressants, beta-blockers, calcium channel blockers, and NSAIDs (nonsteroidal anti-inflammatory drugs). Q: Why are anticonvulsants used to treat migraine? A: During the past few years, there has been an increased interest in antiepileptic drugs for the prevention of migraine. They are sometimes referred to as "anticonvulsants". Both epilepsy and migraine may be caused by similar reactions in the brain.  Q: Why are antidepressants used to treat migraine? A: Antidepressants are typically used to treat people with depression. They may reduce migraine frequency by regulating chemical levels, such as serotonin, in the brain.  Q: What alternative therapies are used to treat migraine? A: The term "alternative therapies" is often used to describe treatments considered outside the scope of conventional Western medicine. Examples of alternative therapy include acupuncture, acupressure, and yoga. Another common alternative treatment is herbal therapy. Some herbs are believed to relieve headache pain. Always discuss alternative therapies with your caregiver before proceeding. Some herbal products contain arsenic and other toxins. TENSION HEADACHES Q: What is a tension-type headache? What causes it?  How can I treat it? A: Tension-type headaches occur randomly. They are often the result of temporary stress, anxiety, fatigue, or anger. Symptoms include soreness in your temples, a tightening band-like sensation around your head (a "vice-like" ache). Symptoms can also include a pulling feeling, pressure sensations, and contracting head and neck muscles. The headache begins in your forehead, temples, or the back of your head and neck. Treatment for tension-type headache may include over-the-counter or prescription medications. Treatment may also include self-help techniques such as relaxation training and biofeedback. CLUSTER HEADACHES Q: What is a cluster headache? What causes it? How can I treat it? A: Cluster headache gets its name because the attacks come in groups. The pain arrives with little, if any, warning. It is usually on one side of the head. A tearing or bloodshot eye and a runny nose on the same side of the headache may also accompany the pain. Cluster headaches are believed to be caused by chemical reactions in the brain. They have been described as the most severe and intense of any headache type. Treatment for cluster headache includes prescription medication and oxygen. SINUS HEADACHES Q: What is a sinus headache? What causes it? How can I treat it? A: When a cavity in the bones of the face and skull (a sinus) becomes inflamed, the inflammation will cause localized pain.  This condition is usually the result of an allergic reaction, a tumor, or an infection. If your headache is caused by a sinus blockage, such as an infection, you will probably have a fever. An x-ray will confirm a sinus blockage. Your caregiver's treatment might include antibiotics for the infection, as well as antihistamines or decongestants.  REBOUND HEADACHES Q: What is a rebound headache? What causes it? How can I treat it? A: A pattern of taking acute headache medications too often can lead to a condition known as  "rebound headache." A pattern of taking too much headache medication includes taking it more than 2 days per week or in excessive amounts. That means more than the label or a caregiver advises. With rebound headaches, your medications not only stop relieving pain, they actually begin to cause headaches. Doctors treat rebound headache by tapering the medication that is being overused. Sometimes your caregiver will gradually substitute a different type of treatment or medication. Stopping may be a challenge. Regularly overusing a medication increases the potential for serious side effects. Consult a caregiver if you regularly use headache medications more than 2 days per week or more than the label advises. ADDITIONAL QUESTIONS AND ANSWERS Q: What is biofeedback? A: Biofeedback is a self-help treatment. Biofeedback uses special equipment to monitor your body's involuntary physical responses. Biofeedback monitors:  Breathing.  Pulse.  Heart rate.  Temperature.  Muscle tension.  Brain activity. Biofeedback helps you refine and perfect your relaxation exercises. You learn to control the physical responses that are related to stress. Once the technique has been mastered, you do not need the equipment any more. Q: Are headaches hereditary? A: Four out of five (80%) of people that suffer report a family history of migraine. Scientists are not sure if this is genetic or a family predisposition. Despite the uncertainty, a child has a 50% chance of having migraine if one parent suffers. The child has a 75% chance if both parents suffer.  Q: Can children get headaches? A: By the time they reach high school, most young people have experienced some type of headache. Many safe and effective approaches or medications can prevent a headache from occurring or stop it after it has begun.  Q: What type of doctor should I see to diagnose and treat my headache? A: Start with your primary caregiver. Discuss his or  her experience and approach to headaches. Discuss methods of classification, diagnosis, and treatment. Your caregiver may decide to recommend you to a headache specialist, depending upon your symptoms or other physical conditions. Having diabetes, allergies, etc., may require a more comprehensive and inclusive approach to your headache. The National [employer] will provide, upon request, a list of [facility] physician members in your state. Document Released: 12/27/2003 Document Revised: 12/29/2011 Document Reviewed: 06/05/2008 [facility] Patient Information 2015 [place], [place]. This information is not intended to replace advice given to you by your health care provider. Make sure you discuss any questions you have with your health care provider.  Nausea and Vomiting Nausea is a sick feeling that often comes before throwing up (vomiting). Vomiting is a reflex where stomach contents come out of your mouth. Vomiting can cause severe loss of body fluids (dehydration). Children and elderly adults can become dehydrated quickly, especially if they also have diarrhea. Nausea and vomiting are symptoms of a condition or disease. It is important to find the cause of your symptoms. CAUSES   Direct irritation of the stomach lining. This irritation can result from increased acid  production (gastroesophageal reflux disease), infection, food poisoning, taking certain medicines (such as nonsteroidal anti-inflammatory drugs), alcohol use, or tobacco use.  Signals from the brain.These signals could be caused by a headache, heat exposure, an inner ear disturbance, increased pressure in the brain from injury, infection, a tumor, or a concussion, pain, emotional stimulus, or metabolic problems.  An obstruction in the gastrointestinal tract (bowel obstruction).  Illnesses such as diabetes, hepatitis, gallbladder problems, appendicitis, kidney problems, cancer, sepsis, atypical symptoms of a heart attack, or eating  disorders.  Medical treatments such as chemotherapy and radiation.  Receiving medicine that makes you sleep (general anesthetic) during surgery. DIAGNOSIS Your caregiver may ask for tests to be done if the problems do not improve after a few days. Tests may also be done if symptoms are severe or if the reason for the nausea and vomiting is not clear. Tests may include:  Urine tests.  Blood tests.  Stool tests.  Cultures (to look for evidence of infection).  X-rays or other imaging studies. Test results can help your caregiver make decisions about treatment or the need for additional tests. TREATMENT You need to stay well hydrated. Drink frequently but in small amounts.You may wish to drink water, sports drinks, clear broth, or eat frozen ice pops or gelatin dessert to help stay hydrated.When you eat, eating slowly may help prevent nausea.There are also some antinausea medicines that may help prevent nausea. HOME CARE INSTRUCTIONS   Take all medicine as directed by your caregiver.  If you do not have an appetite, do not force yourself to eat. However, you must continue to drink fluids.  If you have an appetite, eat a normal diet unless your caregiver tells you differently.  Eat a variety of complex carbohydrates (rice, wheat, potatoes, bread), lean meats, yogurt, fruits, and vegetables.  Avoid high-fat foods because they are more difficult to digest.  Drink enough water and fluids to keep your urine clear or pale yellow.  If you are dehydrated, ask your caregiver for specific rehydration instructions. Signs of dehydration may include:  Severe thirst.  Dry lips and mouth.  Dizziness.  Dark urine.  Decreasing urine frequency and amount.  Confusion.  Rapid breathing or pulse. SEEK IMMEDIATE MEDICAL CARE IF:   You have blood or brown flecks (like coffee grounds) in your vomit.  You have black or bloody stools.  You have a severe headache or stiff neck.  You are  confused.  You have severe abdominal pain.  You have chest pain or trouble breathing.  You do not urinate at least once every 8 hours.  You develop cold or clammy skin.  You continue to vomit for longer than 24 to 48 hours.  You have a fever. MAKE SURE YOU:   Understand these instructions.  Will watch your condition.  Will get help right away if you are not doing well or get worse. Document Released: 10/06/2005 Document Revised: 12/29/2011 Document Reviewed: 03/05/2011 [facility] Patient Information 2015 [place], [place]. This information is not intended to replace advice given to you by your health care provider. Make sure you discuss any questions you have with your health care provider.

## 2015-12-18 ENCOUNTER — Emergency Department
Admission: EM | Admit: 2015-12-18 | Discharge: 2015-12-18 | Disposition: A | Discharge status: Home / Self Care | Payer: Medicaid Other | Attending: Emergency Medicine | Admitting: Emergency Medicine

## 2015-12-18 ENCOUNTER — Encounter: Payer: Self-pay | Admitting: Emergency Medicine

## 2015-12-18 DIAGNOSIS — F17210 Nicotine dependence, cigarettes, uncomplicated: Secondary | ICD-10-CM | POA: Insufficient documentation

## 2015-12-18 DIAGNOSIS — L089 Local infection of the skin and subcutaneous tissue, unspecified: Secondary | ICD-10-CM

## 2015-12-18 DIAGNOSIS — B9689 Other specified bacterial agents as the cause of diseases classified elsewhere: Secondary | ICD-10-CM

## 2015-12-18 DIAGNOSIS — A499 Bacterial infection, unspecified: Secondary | ICD-10-CM | POA: Insufficient documentation

## 2015-12-18 DIAGNOSIS — F209 Schizophrenia, unspecified: Secondary | ICD-10-CM | POA: Diagnosis not present

## 2015-12-18 DIAGNOSIS — L0889 Other specified local infections of the skin and subcutaneous tissue: Secondary | ICD-10-CM | POA: Insufficient documentation

## 2015-12-18 DIAGNOSIS — K610 Anal abscess: Secondary | ICD-10-CM | POA: Diagnosis present

## 2015-12-18 DIAGNOSIS — Z79899 Other long term (current) drug therapy: Secondary | ICD-10-CM | POA: Diagnosis not present

## 2015-12-18 MED ORDER — IBUPROFEN 800 MG PO TABS: 800.0000 mg | ORAL_TABLET | Freq: Three times a day (TID) | ORAL | Status: AC | PRN

## 2015-12-18 MED ORDER — OXYCODONE-ACETAMINOPHEN 7.5-325 MG PO TABS: 1.0000 | ORAL_TABLET | ORAL | Status: AC | PRN

## 2015-12-18 MED ORDER — OXYCODONE-ACETAMINOPHEN 5-325 MG PO TABS: 2.0000 | ORAL_TABLET | Freq: Once | ORAL | Status: DC

## 2015-12-18 MED ORDER — SULFAMETHOXAZOLE-TRIMETHOPRIM 800-160 MG PO TABS
1.0000 | ORAL_TABLET | Freq: Two times a day (BID) | ORAL | Status: AC
Start: 2015-12-18 — End: ?

## 2015-12-18 MED ORDER — IBUPROFEN 800 MG PO TABS
800.0000 mg | ORAL_TABLET | Freq: Once | ORAL | Status: AC
  Administered 2015-12-18: 800 mg via ORAL
  Filled 2015-12-18: qty 1

## 2015-12-18 MED ORDER — OXYCODONE-ACETAMINOPHEN 5-325 MG PO TABS
1.0000 | ORAL_TABLET | Freq: Once | ORAL | Status: AC
  Administered 2015-12-18: 1 via ORAL
  Filled 2015-12-18: qty 1

## 2015-12-18 NOTE — ED Provider Notes (Signed)
[facility] Emergency Department Provider Note  ____________________________________________  Time seen: Approximately 3:48 PM  I have reviewed the triage vital signs and the nursing notes.   HISTORY  Chief Complaint Abscess    HPI Ryan Colon is a 25 y.o. male patient complaining of abscess for 3 days in the perianal area. Denies any discharge from the lesion. Patient states similar incident approximately 2 years ago in same area. Patient also having some right inguinal swelling. Patient stated pain on the right inguinal area increases with sitting and standing. Patient stated no provocative incident for right inguinal swelling. Patient denies any bladder or bowel dysfunction. No palliative measures taken for this complaint. He is rating his pain as a 10 over 10.   Past Medical History  Diagnosis Date  . Schizophrenia (HCC)   . Insomnia     Patient Active Problem List   Diagnosis Date Noted  . 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 Cannabis use disorder, severe, dependence (HCC) 04/02/2015  . Tobacco use disorder 04/02/2015    History reviewed. No pertinent past surgical history.  Current Outpatient Rx  Name  Route  Sig  Dispense  Refill  . ARIPiprazole (ABILIFY) 9.75 MG/1.3ML injection   Intramuscular   Inject into the muscle every 30 (thirty) days.         Marland Kitchen ibuprofen (ADVIL,MOTRIN) 800 MG tablet   Oral   Take 1 tablet (800 mg total) by mouth every 8 (eight) hours as needed.   30 tablet   0   . ondansetron (ZOFRAN ODT) 4 MG disintegrating tablet   Oral   Take 1 tablet (4 mg total) by mouth every 8 (eight) hours as needed for nausea or vomiting.   20 tablet   0   . oxyCODONE-acetaminophen (PERCOCET) 7.5-325 MG tablet   Oral   Take 1 tablet by mouth every 4 (four) hours as needed for severe pain.   20 tablet   0   . paliperidone (INVEGA SUSTENNA) 156 MG/ML SUSP injection   Intramuscular   Inject 1 mL (156 mg total)  into the muscle once.   0.9 mL   0     Due on July 21   . paliperidone (INVEGA) 9 MG 24 hr tablet   Oral   Take 1 tablet (9 mg total) by mouth at bedtime.   7 tablet   0   . sulfamethoxazole-trimethoprim (BACTRIM DS,SEPTRA DS) 800-160 MG tablet   Oral   Take 1 tablet by mouth 2 (two) times daily.   20 tablet   0   . zolpidem (AMBIEN) 10 MG tablet   Oral   Take 1 tablet (10 mg total) by mouth at bedtime.   15 tablet   0     Allergies Abilify and Invega  No family history on file.  Social History Social History  Substance Use Topics  . Smoking status: Current Every Day Smoker -- 0.50 packs/day for 4 years    Types: Cigarettes  . Smokeless tobacco: Never Used  . Alcohol Use: Yes    Review of Systems Constitutional: No fever/chills Eyes: No visual changes. ENT: No sore throat. Cardiovascular: Denies chest pain. Respiratory: Denies shortness of breath. Gastrointestinal: No abdominal pain.  No nausea, no vomiting.  No diarrhea.  No constipation. Genitourinary: Negative for dysuria. Musculoskeletal: Negative for back pain. Skin: Negative for rash. Nodular lesion right perianal area Neurological: Negative for headaches, focal weakness or numbness. Psychiatric:chizophrenia Allergic/Immunilogical: See medication list 10-point ROS otherwise negative.  ____________________________________________   PHYSICAL EXAM:  VITAL SIGNS: ED Triage Vitals  Enc Vitals Group     BP 12/18/15 1248 119/49 mmHg     Pulse Rate 12/18/15 1248 83     Resp --      Temp 12/18/15 1248 98.3 F (36.8 C)     Temp Source 12/18/15 1248 Oral     SpO2 12/18/15 1248 100 %     Weight 12/18/15 1248 210 lb (95.255 kg)     Height 12/18/15 1248  (1.753 m)     Head Cir --      Peak Flow --      Pain Score 12/18/15 1253 10     Pain Loc --      Pain Edu? --      Excl. in GC? --     Constitutional: Alert and oriented. Well appearing and in no acute distress. Eyes: Conjunctivae are  normal. PERRL. EOMI. Head: Atraumatic. Nose: No congestion/rhinnorhea. Mouth/Throat: Mucous membranes are moist.  Oropharynx non-erythematous. Neck: No stridor.  No cervical spine tenderness to palpation. Hematological/Lymphatic/Immunilogical: No cervical lymphadenopathy. Cardiovascular: Normal rate, regular rhythm. Grossly normal heart sounds.  Good peripheral circulation. Respiratory: Normal respiratory effort.  No retractions. Lungs CTAB. Gastrointestinal: Soft and nontender. No distention. No abdominal bruits. No CVA tenderness. Musculoskeletal: No lower extremity tenderness nor edema.  No joint effusions. Neurologic:  Normal speech and language. No gross focal neurologic deficits are appreciated. No gait instability. Skin:  Skin is warm, dry and intact. No rash noted. Nodule lesion right perianal area. Area is nonfluctuant at this time. Psychiatric: Mood and affect are normal. Speech and behavior are normal.  ____________________________________________   LABS (all labs ordered are listed, but only abnormal results are displayed)  Labs Reviewed - No data to display ____________________________________________  EKG   ____________________________________________  RADIOLOGY   ____________________________________________   PROCEDURES  Procedure(s) performed: None  Critical Care performed: No  ____________________________________________   INITIAL IMPRESSION / ASSESSMENT AND PLAN / ED COURSE  Pertinent labs & imaging results that were available during my care of the patient were reviewed by me and considered in my medical decision making (see chart for details).  Skin infection. Discussed the patient's rationale for not I&D and lesion today. Patient will be discharged with prescription for Bactrim DS, Percocet, and ibuprofen. Patient given discharge care instructions and advised return in 48 hours for  reevaluation. ____________________________________________   FINAL CLINICAL IMPRESSION(S) / ED DIAGNOSES  Final diagnoses:  Localized bacterial infection of skin      Joni Reining, PA-C 12/18/15 1605  Arnaldo Natal, MD 12/18/15 1725

## 2015-12-18 NOTE — ED Notes (Signed)
C/o boil to perianal area.  Patient has history of same, last episode about 1-2 years ago.

## 2015-12-18 NOTE — Discharge Instructions (Signed)
Apply warm compresses area for 5 minutes 4 times a day. Take medication as directed. Return back in 2 days for reevaluation.

## 2015-12-18 NOTE — ED Notes (Signed)
Pt here with reports of an abscess to his perianal area

## 2016-07-18 ENCOUNTER — Emergency Department
Admission: EM | Admit: 2016-07-18 | Discharge: 2016-07-18 | Disposition: A | Discharge status: Home / Self Care | Payer: Medicaid Other | Attending: Emergency Medicine | Admitting: Emergency Medicine

## 2016-07-18 ENCOUNTER — Emergency Department: Payer: Medicaid Other

## 2016-07-18 DIAGNOSIS — N451 Epididymitis: Secondary | ICD-10-CM | POA: Insufficient documentation

## 2016-07-18 DIAGNOSIS — N5089 Other specified disorders of the male genital organs: Secondary | ICD-10-CM

## 2016-07-18 DIAGNOSIS — F17210 Nicotine dependence, cigarettes, uncomplicated: Secondary | ICD-10-CM | POA: Insufficient documentation

## 2016-07-18 DIAGNOSIS — N509 Disorder of male genital organs, unspecified: Secondary | ICD-10-CM | POA: Insufficient documentation

## 2016-07-18 DIAGNOSIS — N50812 Left testicular pain: Secondary | ICD-10-CM

## 2016-07-18 LAB — URINALYSIS COMPLETE WITH MICROSCOPIC (ARMC ONLY)
BILIRUBIN URINE: NEGATIVE
Bacteria, UA: NONE SEEN
Glucose, UA: NEGATIVE mg/dL
Ketones, ur: NEGATIVE mg/dL
Nitrite: NEGATIVE
PH: 6 (ref 5.0–8.0)
Protein, ur: NEGATIVE mg/dL
Specific Gravity, Urine: 1.006 (ref 1.005–1.030)
Squamous Epithelial / LPF: NONE SEEN

## 2016-07-18 MED ORDER — NAPROXEN 500 MG PO TABS: 500.0000 mg | ORAL_TABLET | Freq: Two times a day (BID) | ORAL | 0 refills | Status: AC

## 2016-07-18 MED ORDER — CEFTRIAXONE SODIUM 250 MG IJ SOLR
250.0000 mg | Freq: Once | INTRAMUSCULAR | Status: AC
  Administered 2016-07-18: 250 mg via INTRAMUSCULAR
  Filled 2016-07-18: qty 250

## 2016-07-18 MED ORDER — DOXYCYCLINE HYCLATE 100 MG PO CAPS: 100.0000 mg | ORAL_CAPSULE | Freq: Two times a day (BID) | ORAL | 0 refills | Status: AC

## 2016-07-18 NOTE — ED Notes (Signed)
Reviewed d/c instructions, follow-up care and prescriptions with pt. Pt verbalized understanding 

## 2016-07-18 NOTE — ED Triage Notes (Addendum)
Pt c/o left groin pain and knot X 2.5 days. Recurrent since 1 year ago. Pt alert and oriented X4, active, cooperative, pt in NAD. RR even and unlabored, color WNL.

## 2016-07-18 NOTE — Discharge Instructions (Signed)
Please take medications as prescribed.   Wear supportive underwear

## 2016-07-18 NOTE — ED Provider Notes (Signed)
[facility] Emergency Department Provider Note  ____________________________________________  Time seen: Approximately 6:07 PM  I have reviewed the triage vital signs and the nursing notes.   HISTORY  Chief Complaint Groin Pain    HPI Ryan Colon is a 25 y.o. male , NAD, presents to the emergency department accompanied by his wife who assists with history. Patient states he has had a growth about the left testicle over the last few days. States the area is becoming larger and more painful. Has not noted any redness, warmth or skin sores to the area. Denies any fevers, chills, body aches, dysuria, hematuria, urethral discharge, changes in urinary patterns. Has had no injury or trauma to the groin. States he does have a history of gluteal abscesses in which have been treated with antibiotics in the past. Has not had any abdominal pain, nausea, vomiting, chest pain, shortness of breath.   Past Medical History:  Diagnosis Date  . Insomnia   . Schizophrenia [facility](HCC)     Patient Active Problem List   Diagnosis Date Noted  . 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 Cannabis use disorder, severe, dependence (HCC) 04/02/2015  . Tobacco use disorder 04/02/2015    History reviewed. No pertinent surgical history.  Prior to Admission medications   Medication Sig Start Date End Date Taking? Authorizing Provider  ARIPiprazole (ABILIFY) 9.75 MG/1.3ML injection Inject into the muscle every 30 (thirty) days.    Historical Provider, MD  doxycycline (VIBRAMYCIN) 100 MG capsule Take 1 capsule (100 mg total) by mouth 2 (two) times daily. 07/18/16 07/28/16  Lyncoln Ledgerwood L Rossi Silvestro, PA-C  ibuprofen (ADVIL,MOTRIN) 800 MG tablet Take 1 tablet (800 mg total) by mouth every 8 (eight) hours as needed. 12/18/15   Joni Reiningonald K Smith, PA-C  naproxen (NAPROSYN) 500 MG tablet Take 1 tablet (500 mg total) by mouth 2 (two) times daily with a meal. 07/18/16   Virgina Deakins L Crystelle Ferrufino, PA-C  ondansetron  (ZOFRAN ODT) 4 MG disintegrating tablet Take 1 tablet (4 mg total) by mouth every 8 (eight) hours as needed for nausea or vomiting. 05/29/15   Rebecka ApleyAllison P Webster, MD  oxyCODONE-acetaminophen (PERCOCET) 7.5-325 MG tablet Take 1 tablet by mouth every 4 (four) hours as needed for severe pain. 12/18/15   Joni Reiningonald K Smith, PA-C  paliperidone (INVEGA SUSTENNA) 156 MG/ML SUSP injection Inject 1 mL (156 mg total) into the muscle once. 05/10/15   Jimmy FootmanAndrea Hernandez-Gonzalez, MD  paliperidone (INVEGA) 9 MG 24 hr tablet Take 1 tablet (9 mg total) by mouth at bedtime. 04/17/15   Jimmy FootmanAndrea Hernandez-Gonzalez, MD  sulfamethoxazole-trimethoprim (BACTRIM DS,SEPTRA DS) 800-160 MG tablet Take 1 tablet by mouth 2 (two) times daily. 12/18/15   Joni Reiningonald K Smith, PA-C  zolpidem (AMBIEN) 10 MG tablet Take 1 tablet (10 mg total) by mouth at bedtime. 04/17/15   Jimmy FootmanAndrea Hernandez-Gonzalez, MD    Allergies Abilify [aripiprazole] and Invega [paliperidone]  No family history on file.  Social History Social History  Substance Use Topics  . Smoking status: Current Every Day Smoker    Packs/day: 0.50    Years: 4.00    Types: Cigarettes  . Smokeless tobacco: Never Used  . Alcohol use Yes     Review of Systems Constitutional: No fever/chills Cardiovascular: No chest pain. Respiratory: No shortness of breath.  Gastrointestinal: No abdominal pain.  No nausea, vomiting.   Genitourinary: Positive gross left testicle with pain. Negative for dysuria, hematuria, urethral discharge. No urinary hesitancy, urgency or increased frequency. Musculoskeletal: Negative for General myalgias.  Skin:  Negative for rash, redness, swelling, skin sores. Neurological: Negative for saddle paresthesias or loss of bowel or bladder control. 10-point ROS otherwise negative.  ____________________________________________   PHYSICAL EXAM:  VITAL SIGNS: ED Triage Vitals  Enc Vitals Group     BP 07/18/16 1714 (!) 158/55     Pulse Rate 07/18/16 1713 80      Resp 07/18/16 1713 18     Temp 07/18/16 1713 98.5 F (36.9 C)     Temp Source 07/18/16 1713 Oral     SpO2 07/18/16 1713 98 %     Weight 07/18/16 1713 190 lb (86.2 kg)     Height 07/18/16 1713 5\' 8"  (1.727 m)     Head Circumference --      Peak Flow --      Pain Score 07/18/16 1713 7     Pain Loc --      Pain Edu? --      Excl. in GC? --      Constitutional: Alert and oriented. Well appearing and in no acute distress. Eyes: Conjunctivae are normal.  Head: Atraumatic. Hematological/Lymphatic/Immunilogical: No inguinal lymphadenopathy. Cardiovascular: Good peripheral circulation. Respiratory: Normal respiratory effort without tachypnea or retractions. Genitourinary: Tenderness to palpation of the left testicle with 2.5cm mass palpated. Mass is felt to be epididymal swelling. No hardening of the mass and it is mobile. No urethral discharge. No irritation of the urethra. No skin sores, redness or abnormal warmth are noted. Musculoskeletal: No lower extremity tenderness nor edema.  No joint effusions. Neurologic:  Normal speech and language. No gross focal neurologic deficits are appreciated.  Skin:  Skin is warm, dry and intact. No rash noted. Psychiatric: Mood and affect are normal. Speech and behavior are normal. Patient exhibits appropriate insight and judgement.   ____________________________________________   LABS (all labs ordered are listed, but only abnormal results are displayed)  Labs Reviewed  URINALYSIS COMPLETEWITH MICROSCOPIC ([facility] ONLY) - Abnormal; Notable for the following:       Result Value   Color, Urine STRAW (*)    APPearance CLEAR (*)    Hgb urine dipstick 1+ (*)    Leukocytes, UA TRACE (*)    All other components within normal limits   ____________________________________________  EKG  None ____________________________________________  RADIOLOGY I have personally viewed and evaluated these images (plain radiographs) as part of my medical decision  making, as well as reviewing the written report by the radiologist.  US Scrotum  Result Date: 07/18/2016 CLINICAL DATA:  Left testicular pain and mass for 2 days EXAM: SCROTAL ULTRASOUND DOPPLER ULTRASOUND OF THE TESTICLES TECHNIQUE: Complete ultrasound examination of the testicles, epididymis, and other scrotal structures was performed. Color and spectral Doppler ultrasound were also utilized to evaluate blood flow to the testicles. COMPARISON:  None. FINDINGS: Right testicle Measurements: 4.9 x 2.4 x 2.8 cm. No mass or microlithiasis visualized. Left testicle Measurements: 4.7 x 2.3 x 3.5 cm. No mass or microlithiasis visualized. Right epididymis:  Normal in size and appearance. Left epididymis: The left epididymal tail is enlarged with increased vascular flow demonstrated. Hydrocele:  Small left hydrocele. Varicocele:  None visualized. Pulsed Doppler interrogation of both testes demonstrates normal low resistance arterial and venous waveforms bilaterally. IMPRESSION: Enlarged, hyperemic left epididymal tail, consistent with epididymitis. No testicular mass. Small left hydrocele. Electronically Signed   By: Deatra Robinson M.D.   On: 07/18/2016 20:18   [place] Art/ven Flow Abd Pelv Doppler  Result Date: 07/18/2016 CLINICAL DATA:  Left testicular pain and mass for 2  days EXAM: SCROTAL ULTRASOUND DOPPLER ULTRASOUND OF THE TESTICLES TECHNIQUE: Complete ultrasound examination of the testicles, epididymis, and other scrotal structures was performed. Color and spectral Doppler ultrasound were also utilized to evaluate blood flow to the testicles. COMPARISON:  None. FINDINGS: Right testicle Measurements: 4.9 x 2.4 x 2.8 cm. No mass or microlithiasis visualized. Left testicle Measurements: 4.7 x 2.3 x 3.5 cm. No mass or microlithiasis visualized. Right epididymis:  Normal in size and appearance. Left epididymis: The left epididymal tail is enlarged with increased vascular flow demonstrated. Hydrocele:  Small left  hydrocele. Varicocele:  None visualized. Pulsed Doppler interrogation of both testes demonstrates normal low resistance arterial and venous waveforms bilaterally. IMPRESSION: Enlarged, hyperemic left epididymal tail, consistent with epididymitis. No testicular mass. Small left hydrocele. Electronically Signed   By: Deatra Robinson M.D.   On: 07/18/2016 20:18    ____________________________________________    PROCEDURES  Procedure(s) performed: None   Procedures   Medications  cefTRIAXone (ROCEPHIN) injection 250 mg (250 mg Intramuscular Given 07/18/16 2037)     ____________________________________________   INITIAL IMPRESSION / ASSESSMENT AND PLAN / ED COURSE  Pertinent labs & imaging results that were available during my care of the patient were reviewed by me and considered in my medical decision making (see chart for details).  Clinical Course  Comment By Time  Laboratory and imaging results were discussed with patient and his significant other at bedside. He is in agreement with receiving IM Rocephin at this time and will follow with urology if not improving. Hope Pigeon, PA-C 09/29 2036    Patient's diagnosis is consistent with Acute epididymitis. Patient was given IM Rocephin and tolerated well without side effects or immediate adverse events. Patient will be discharged home with prescriptions for doxycycline and Naprosyn to take as directed. Patient advised to wear supportive undergarments. Patient is to follow up with urology if symptoms persist past this treatment course. Patient is given ED precautions to return to the ED for any worsening or new symptoms.    ____________________________________________  FINAL CLINICAL IMPRESSION(S) / ED DIAGNOSES  Final diagnoses:  Testicular mass  Testicular pain, left  Epididymitis      NEW MEDICATIONS STARTED DURING THIS VISIT:  Discharge Medication List as of 07/18/2016  8:36 PM    START taking these medications    Details  doxycycline (VIBRAMYCIN) 100 MG capsule Take 1 capsule (100 mg total) by mouth 2 (two) times daily., Starting Fri 07/18/2016, Until Mon 07/28/2016, Print    naproxen (NAPROSYN) 500 MG tablet Take 1 tablet (500 mg total) by mouth 2 (two) times daily with a meal., Starting Fri 07/18/2016, Print             Ernestene Kiel Atoka, PA-C 07/18/16 2108    Rockne Menghini, MD 07/18/16 2355

## 2016-08-05 IMAGING — CT CT HEAD W/O CM
1 series · 16 of 30 positions shown, 20 images · non-contrast
Comparison: None.

CLINICAL DATA: Psychosis.

EXAM:
CT HEAD WITHOUT CONTRAST
TECHNIQUE: Contiguous axial images were obtained from the base of the skull
through the vertex without intravenous contrast.

[Series 2: head wo · axial · 0.44mm/px · z∈[+179,+324]mm · 16 of 33 slices shown, 20 images]
[im 2/33  brain]
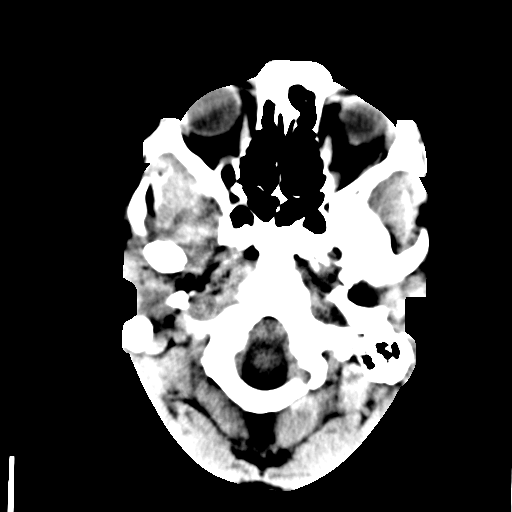
[im 2/33  bone]
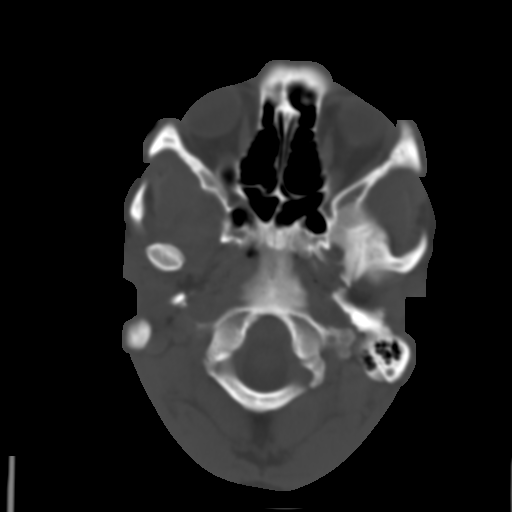
[im 4/33  brain]
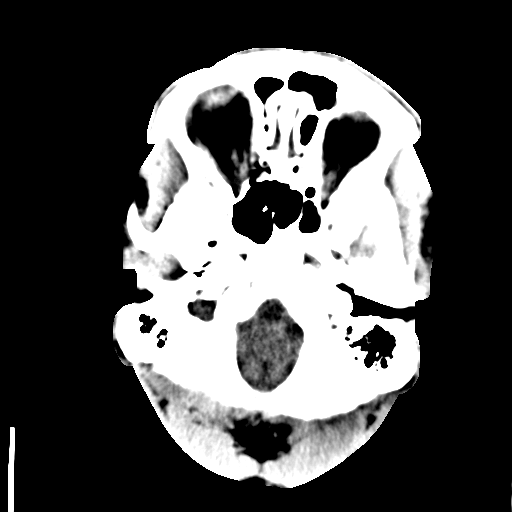
[im 6/33  brain]
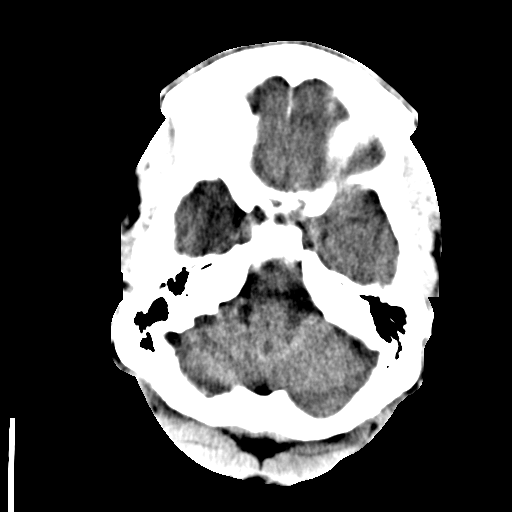
[im 8/33  brain]
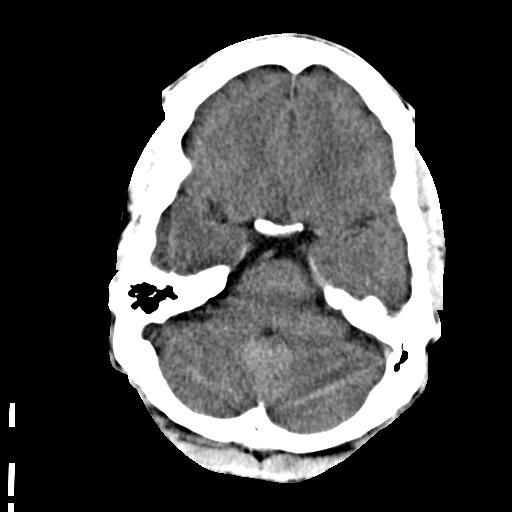
[im 9/33  brain]
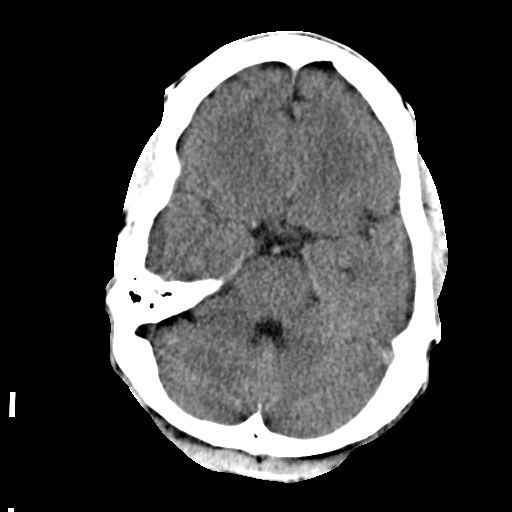
[im 9/33  bone]
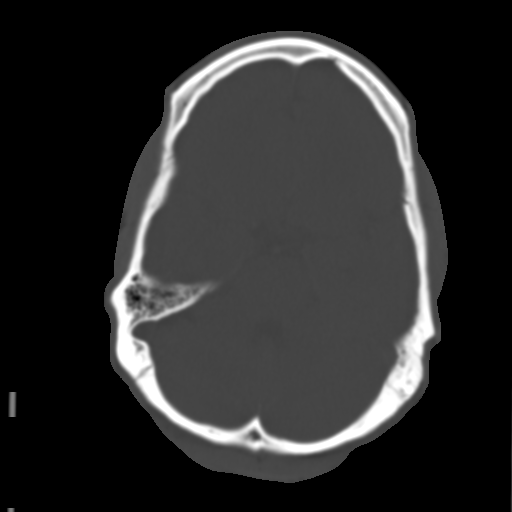
[im 12/33  brain]
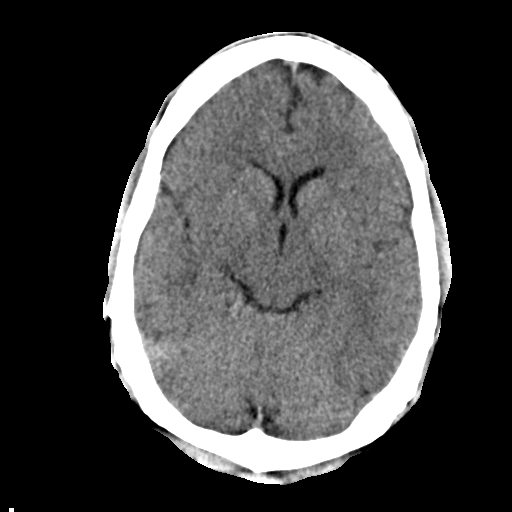
[im 14/33  brain]
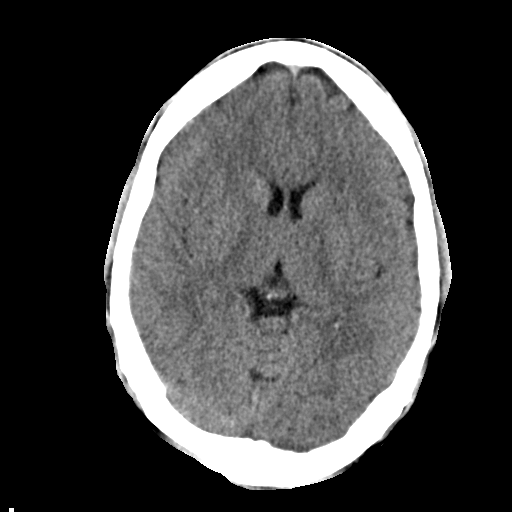
[im 16/33  brain]
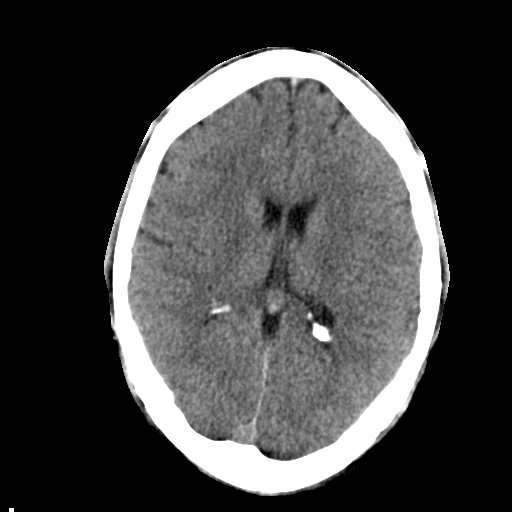
[im 17/33  brain]
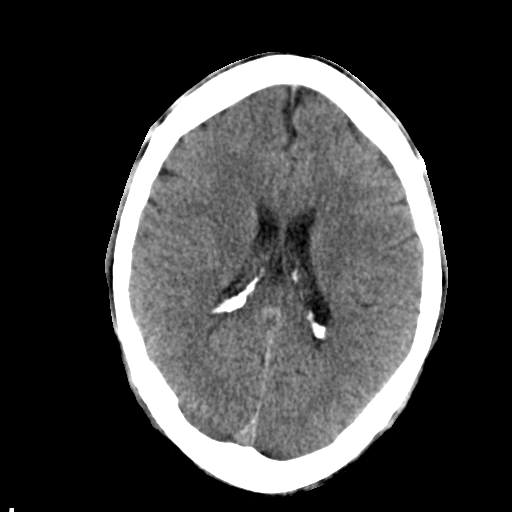
[im 17/33  bone]
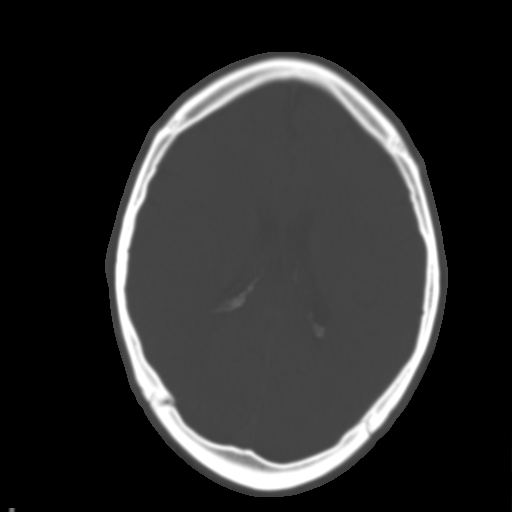
[im 19/33  brain]
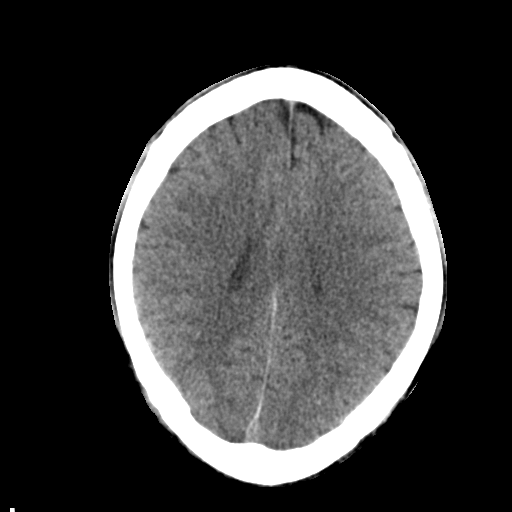
[im 21/33  brain]
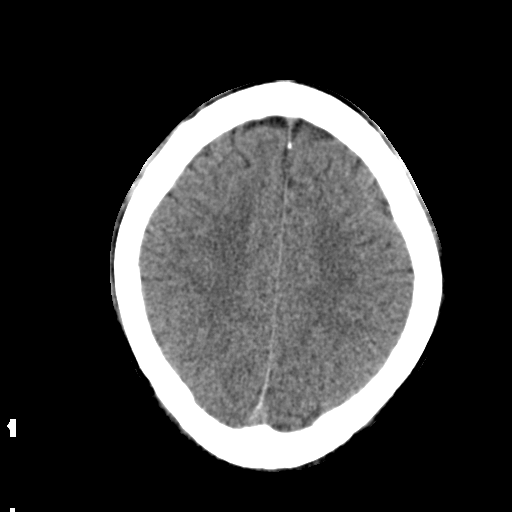
[im 24/33  brain]
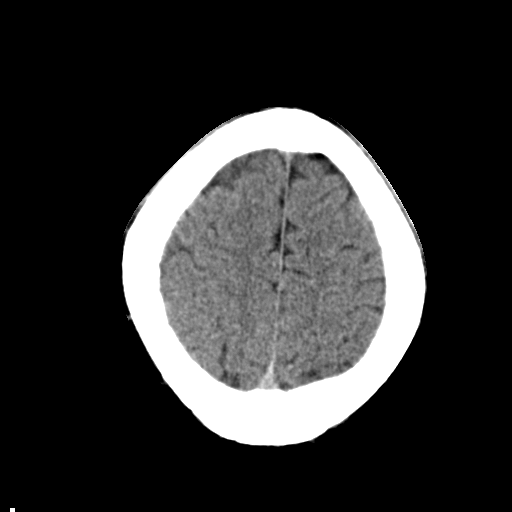
[im 25/33  brain]
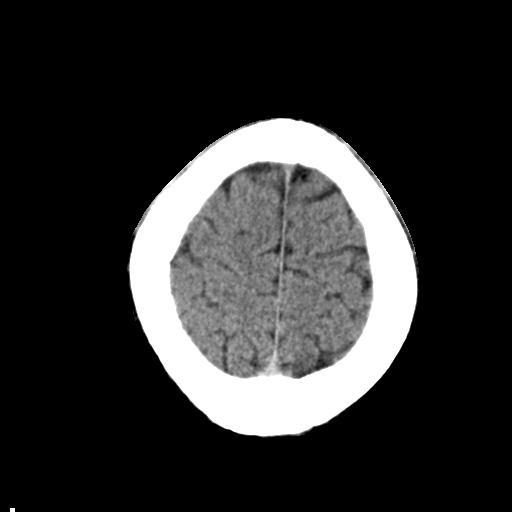
[im 25/33  bone]
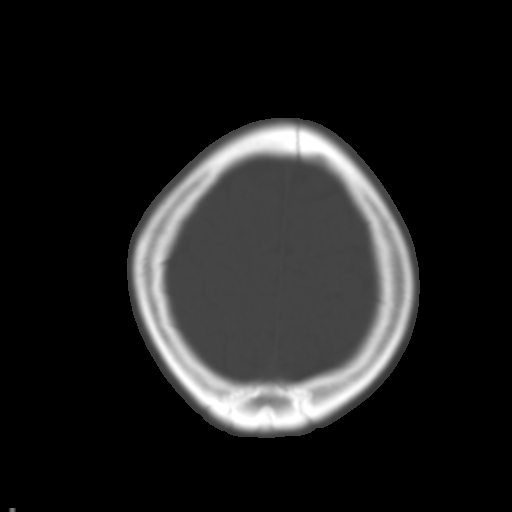
[im 27/33  brain]
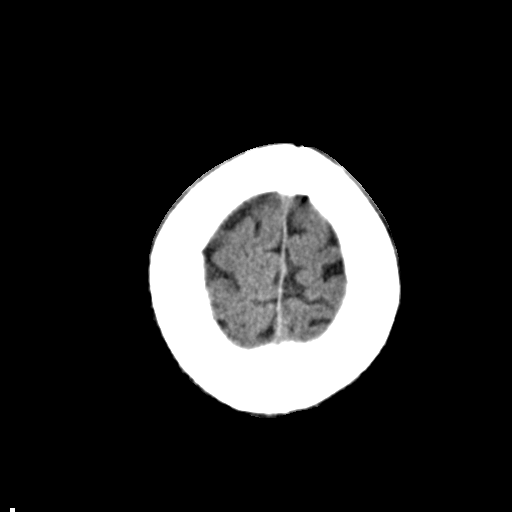
[im 29/33  brain]
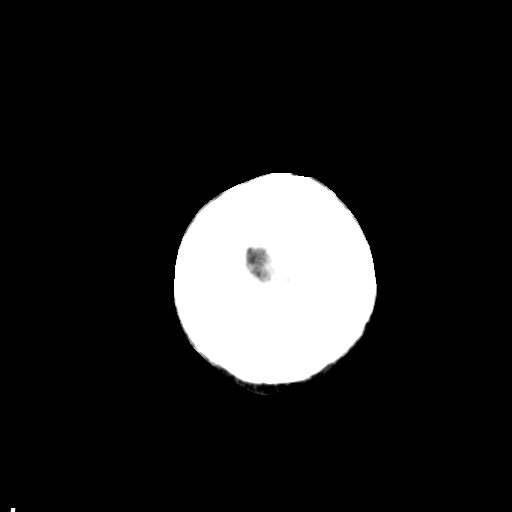
[im 31/33  brain]
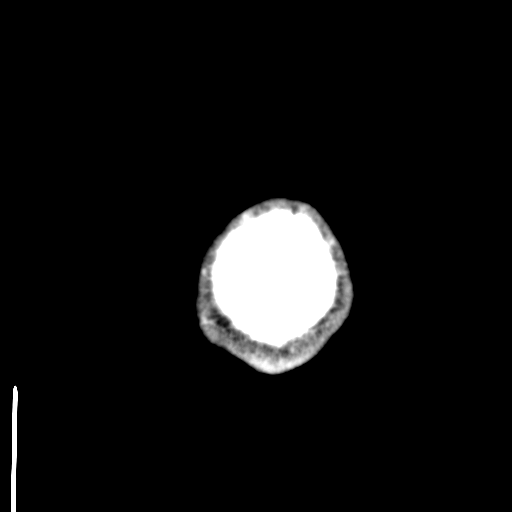

[16 of 30 positions shown; findings below may reference images not displayed]

FINDINGS: The brainstem, cerebellum, cerebral peduncles, thalamus, basal
ganglia, basilar cisterns, and ventricular system appear within
normal limits. No intracranial hemorrhage, mass lesion, or acute
CVA.
IMPRESSION: 1.  No significant abnormality identified.

## 2016-10-06 ENCOUNTER — Encounter: Payer: Self-pay | Admitting: Emergency Medicine

## 2016-10-06 ENCOUNTER — Emergency Department
Admission: EM | Admit: 2016-10-06 | Discharge: 2016-10-06 | Disposition: A | Discharge status: Home / Self Care | Payer: MEDICAID | Attending: Emergency Medicine | Admitting: Emergency Medicine

## 2016-10-06 DIAGNOSIS — Z91199 Patient's noncompliance with other medical treatment and regimen due to unspecified reason: Secondary | ICD-10-CM

## 2016-10-06 DIAGNOSIS — F1290 Cannabis use, unspecified, uncomplicated: Secondary | ICD-10-CM | POA: Insufficient documentation

## 2016-10-06 DIAGNOSIS — F918 Other conduct disorders: Secondary | ICD-10-CM | POA: Insufficient documentation

## 2016-10-06 DIAGNOSIS — Z79899 Other long term (current) drug therapy: Secondary | ICD-10-CM | POA: Insufficient documentation

## 2016-10-06 DIAGNOSIS — Z87891 Personal history of nicotine dependence: Secondary | ICD-10-CM | POA: Insufficient documentation

## 2016-10-06 DIAGNOSIS — R4689 Other symptoms and signs involving appearance and behavior: Secondary | ICD-10-CM

## 2016-10-06 DIAGNOSIS — F203 Undifferentiated schizophrenia: Secondary | ICD-10-CM

## 2016-10-06 DIAGNOSIS — Z9119 Patient's noncompliance with other medical treatment and regimen: Secondary | ICD-10-CM

## 2016-10-06 LAB — COMPREHENSIVE METABOLIC PANEL
ALBUMIN: 4.4 g/dL (ref 3.5–5.0)
ALT: 26 U/L (ref 17–63)
AST: 57 U/L — ABNORMAL HIGH (ref 15–41)
Alkaline Phosphatase: 53 U/L (ref 38–126)
Anion gap: 6 (ref 5–15)
BUN: 21 mg/dL — ABNORMAL HIGH (ref 6–20)
CHLORIDE: 103 mmol/L (ref 101–111)
CO2: 31 mmol/L (ref 22–32)
CREATININE: 1.42 mg/dL — AB (ref 0.61–1.24)
Calcium: 9.4 mg/dL (ref 8.9–10.3)
GFR calc Af Amer: >60 mL/min (ref >60)
GFR calc non Af Amer: >60 mL/min (ref >60)
GLUCOSE: 81 mg/dL (ref 65–99)
Potassium: 3.7 mmol/L (ref 3.5–5.1)
SODIUM: 140 mmol/L (ref 135–145)
Total Bilirubin: 1 mg/dL (ref 0.3–1.2)
Total Protein: 8.1 g/dL (ref 6.5–8.1)

## 2016-10-06 LAB — CBC
HEMATOCRIT: 40.7 % (ref 40.0–52.0)
Hemoglobin: 14 g/dL (ref 13.0–18.0)
MCH: 30.1 pg (ref 26.0–34.0)
MCHC: 34.5 g/dL (ref 32.0–36.0)
MCV: 87.4 fL (ref 80.0–100.0)
PLATELETS: 308 10*3/uL (ref 150–440)
RBC: 4.66 MIL/uL (ref 4.40–5.90)
RDW: 13.6 % (ref 11.5–14.5)
WBC: 5.7 10*3/uL (ref 3.8–10.6)

## 2016-10-06 LAB — URINE DRUG SCREEN, QUALITATIVE (ARMC ONLY)
Amphetamines, Ur Screen: NOT DETECTED
Barbiturates, Ur Screen: NOT DETECTED
Benzodiazepine, Ur Scrn: NOT DETECTED
CANNABINOID 50 NG, UR ~~LOC~~: NOT DETECTED
Cocaine Metabolite,Ur ~~LOC~~: NOT DETECTED
MDMA (ECSTASY) UR SCREEN: NOT DETECTED
Methadone Scn, Ur: NOT DETECTED
Opiate, Ur Screen: NOT DETECTED
PHENCYCLIDINE (PCP) UR S: NOT DETECTED
Tricyclic, Ur Screen: NOT DETECTED

## 2016-10-06 LAB — ETHANOL: Alcohol, Ethyl (B): <5 mg/dL (ref <5)

## 2016-10-06 MED ORDER — ARIPIPRAZOLE 15 MG PO TABS: 15.0000 mg | ORAL_TABLET | Freq: Every day | ORAL | 1 refills | Status: AC

## 2016-10-06 MED ORDER — ARIPIPRAZOLE ER 400 MG IM SRER
400.0000 mg | Freq: Once | INTRAMUSCULAR | Status: AC
  Administered 2016-10-06: 400 mg via INTRAMUSCULAR
  Filled 2016-10-06 (×2): qty 400

## 2016-10-06 MED ORDER — SODIUM CHLORIDE 0.9 % IV BOLUS (SEPSIS)
1000.0000 mL | Freq: Once | INTRAVENOUS | Status: AC
  Administered 2016-10-06: 1000 mL via INTRAVENOUS

## 2016-10-06 NOTE — ED Triage Notes (Signed)
Patient states he came in due to family concerns regarding his anger management. Denies SI or HI. States harms property but not persons.

## 2016-10-06 NOTE — ED Notes (Signed)
Called pharmacy and spoke with Belenda CruiseKristin concerning abilify injection - I will await med from pharmacy  Pt to discharge to home

## 2016-10-06 NOTE — ED Notes (Signed)

## 2016-10-06 NOTE — Discharge Instructions (Signed)
We noticed that her creatinine is slightly up. This is a measure your kidney function. Drink plenty of fluid. We recommend that you follow-up closely with a primary care doctor in the next week or so for a recheck. Minor care may be able to do this as well. He had not expressed any thoughts of hurting herself or anyone else and it is good that he came in here for care. If you have any thoughts of hurting herself or anyone else please return to the emergency department. Take the medications as prescribed by Dr. Toni Amendlapacs   follow-up closely as an outpatient

## 2016-10-06 NOTE — ED Notes (Signed)
Spoke with pharmacy regarding ER aripiprazole, pharmacy to send.

## 2016-10-06 NOTE — ED Notes (Signed)
Pt discharged to home.  Friend driving.  Discharge instructions reviewed.  Verbalized understanding.  No questions or concerns at this time.  Teach back verified.  Pt in NAD.  No items left in ED.   

## 2016-10-06 NOTE — ED Notes (Signed)
VOL/ Consult completed/ Plan to D/C 

## 2016-10-06 NOTE — ED Notes (Signed)
BEHAVIORAL HEALTH ROUNDING Patient sleeping: No. Patient alert and oriented: yes Behavior appropriate: Yes.  ; If no, describe:  Nutrition and fluids offered: yes Toileting and hygiene offered: Yes  Sitter present: q15 minute observations and security  monitoring Law enforcement present: Yes  ODS  

## 2016-10-06 NOTE — Consult Note (Signed)
[facility] Consult   Reason for Consult:  Consult for 25 year old man with a history of schizophrenia who comes voluntarily seeking treatment Referring Physician:  [place] Patient Identification: Ryan Colon MRN:  [id number] Principal Diagnosis: Undifferentiated schizophrenia [facility]) Diagnosis:   Patient Active Problem List   Diagnosis Date Noted  . Noncompliance [Z91.19] 10/06/2016  . Undifferentiated schizophrenia ([place]) [F20.3] 04/06/2015  . Cannabis use disorder, severe, dependence ([place]) [F12.20] 04/02/2015  . Tobacco use disorder [F17.200] 04/02/2015    Total Time spent with patient: 1 hour  Subjective:   Ryan Colon is a 25 y.o. male patient admitted with "my family is worried".  HPI:  Patient interviewed. Chart reviewed. 25 year old man who has a past history of schizophrenia. He spent the last day in jail after kicking in the door of his girlfriends apartment. He did not actually injure anyone. Family says that his anger and impulsivity are too much. Patient says that he feels generally okay but a little bit anxious. Denies any problem with sleep or appetite. He denies that he is having any hallucinations or paranoia. Denies suicidal or homicidal ideation. Denies that he is abusing alcohol or drugs recently. He used to go for psychiatric follow-up at [facility] but stopped treatment about a year ago. He admits that when he was taking antipsychotics he was a little more stable in his thinking. Patient says that his family told him that if he didn't get back on treatment they were going to try to take him back to jail.  Social history: Not currently working. Has a girlfriend who is pregnant. Living with his mother.  Substance abuse history: History of cannabis abuse. Not currently using alcohol or drugs.  Medical history: No significant medical problems  Past Psychiatric History: Patient has a history of schizophrenia. No history of suicide attempts. Some history of  aggression in the past. No recent violence. Had been on both Invega and abilify in the past. He thinks the Abilify was better tolerated.  Risk to Self: Is patient at risk for suicide?: No Risk to Others:   Prior Inpatient Therapy:   Prior Outpatient Therapy:    Past Medical History:  Past Medical History:  Diagnosis Date  . Insomnia   . Schizophrenia ([place])    History reviewed. No pertinent surgical history. Family History: No family history on file. Family Psychiatric  History: Does not know of any family history Social History:  History  Alcohol Use  . Yes     History  Drug Use  . Frequency: 3.0 times per week  . Types: Marijuana    Social History   Social History  . Marital status: Single    Spouse name: N/A  . Number of children: N/A  . Years of education: N/A   Social History Main Topics  . Smoking status: Former Smoker    Packs/day: 0.50    Years: 4.00    Types: Cigarettes  . Smokeless tobacco: Never Used  . Alcohol use Yes  . Drug use:     Frequency: 3.0 times per week    Types: Marijuana  . Sexual activity: Yes   Other Topics Concern  . None   Social History Narrative  . None   Additional Social History:    Allergies:   Allergies  Allergen Reactions  . Abilify [Aripiprazole] Nausea And Vomiting  . Invega [Paliperidone] Nausea And Vomiting    Labs:  Results for orders placed or performed during the hospital encounter of 10/06/16 (from the past  48 hour(s))  Comprehensive metabolic panel     Status: Abnormal   Collection Time: 10/06/16  2:20 PM  Result Value Ref Range   Sodium 140 135 - 145 mmol/L   Potassium 3.7 3.5 - 5.1 mmol/L   Chloride 103 101 - 111 mmol/L   CO2 31 22 - 32 mmol/L   Glucose, Bld 81 65 - 99 mg/dL   BUN 21 (H) 6 - 20 mg/dL   Creatinine, Ser 1.42 (H) 0.61 - 1.24 mg/dL   Calcium 9.4 8.9 - 10.3 mg/dL   Total Protein 8.1 6.5 - 8.1 g/dL   Albumin 4.4 3.5 - 5.0 g/dL   AST 57 (H) 15 - 41 U/L   ALT 26 17 - 63 U/L   Alkaline  Phosphatase 53 38 - 126 U/L   Total Bilirubin 1.0 0.3 - 1.2 mg/dL   GFR calc non Af Amer >60 >60 mL/min   GFR calc Af Amer >60 >60 mL/min    Comment: (NOTE) The eGFR has been calculated using the CKD EPI equation. This calculation has not been validated in all clinical situations. eGFR's persistently <60 mL/min signify possible Chronic Kidney Disease.    Anion gap 6 5 - 15  Ethanol     Status: None   Collection Time: 10/06/16  2:20 PM  Result Value Ref Range   Alcohol, Ethyl (B) <5 <5 mg/dL    Comment:        LOWEST DETECTABLE LIMIT FOR SERUM ALCOHOL IS 5 mg/dL FOR MEDICAL PURPOSES ONLY   cbc     Status: None   Collection Time: 10/06/16  2:20 PM  Result Value Ref Range   WBC 5.7 3.8 - 10.6 K/uL   RBC 4.66 4.40 - 5.90 MIL/uL   Hemoglobin 14.0 13.0 - 18.0 g/dL   HCT 40.7 40.0 - 52.0 %   MCV 87.4 80.0 - 100.0 fL   MCH 30.1 26.0 - 34.0 pg   MCHC 34.5 32.0 - 36.0 g/dL   RDW 13.6 11.5 - 14.5 %   Platelets 308 150 - 440 K/uL  Urine Drug Screen, Qualitative     Status: None   Collection Time: 10/06/16  2:20 PM  Result Value Ref Range   Tricyclic, Ur Screen NONE DETECTED NONE DETECTED   Amphetamines, Ur Screen NONE DETECTED NONE DETECTED   MDMA (Ecstasy)Ur Screen NONE DETECTED NONE DETECTED   Cocaine Metabolite,Ur [place] NONE DETECTED NONE DETECTED   Opiate, Ur Screen NONE DETECTED NONE DETECTED   Phencyclidine (PCP) Ur S NONE DETECTED NONE DETECTED   Cannabinoid 50 Ng, Ur [place] NONE DETECTED NONE DETECTED   Barbiturates, Ur Screen NONE DETECTED NONE DETECTED   Benzodiazepine, Ur Scrn NONE DETECTED NONE DETECTED   Methadone Scn, Ur NONE DETECTED NONE DETECTED    Comment: (NOTE) 811  Tricyclics, urine               Cutoff 1000 ng/mL 200  Amphetamines, urine             Cutoff 1000 ng/mL 300  MDMA (Ecstasy), urine           Cutoff 500 ng/mL 400  Cocaine Metabolite, urine       Cutoff 300 ng/mL 500  Opiate, urine                   Cutoff 300 ng/mL 600  Phencyclidine (PCP), urine       Cutoff 25 ng/mL 700  Cannabinoid, urine  Cutoff 50 ng/mL 800  Barbiturates, urine             Cutoff 200 ng/mL 900  Benzodiazepine, urine           Cutoff 200 ng/mL 1000 Methadone, urine                Cutoff 300 ng/mL 1100 1200 The urine drug screen provides only a preliminary, unconfirmed 1300 analytical test result and should not be used for non-medical 1400 purposes. Clinical consideration and professional judgment should 1500 be applied to any positive drug screen result due to possible 1600 interfering substances. A more specific alternate chemical method 1700 must be used in order to obtain a confirmed analytical result.  1800 Gas chromato graphy / mass spectrometry (GC/MS) is the preferred 1900 confirmatory method.     Current Facility-Administered Medications  Medication Dose Route Frequency Provider Last Rate Last Dose  . ARIPiprazole ER SRER 400 mg  400 mg Intramuscular Once Gonzella Lex, MD      . sodium chloride 0.9 % bolus 1,000 mL  1,000 mL Intravenous Once Schuyler Amor, MD       Current Outpatient Prescriptions  Medication Sig Dispense Refill  . ARIPiprazole (ABILIFY) 15 MG tablet Take 1 tablet (15 mg total) by mouth daily. 30 tablet 1  . ARIPiprazole (ABILIFY) 9.75 MG/1.3ML injection Inject into the muscle every 30 (thirty) days.    Marland Kitchen ibuprofen (ADVIL,MOTRIN) 800 MG tablet Take 1 tablet (800 mg total) by mouth every 8 (eight) hours as needed. 30 tablet 0  . naproxen (NAPROSYN) 500 MG tablet Take 1 tablet (500 mg total) by mouth 2 (two) times daily with a meal. 14 tablet 0  . ondansetron (ZOFRAN ODT) 4 MG disintegrating tablet Take 1 tablet (4 mg total) by mouth every 8 (eight) hours as needed for nausea or vomiting. 20 tablet 0  . oxyCODONE-acetaminophen (PERCOCET) 7.5-325 MG tablet Take 1 tablet by mouth every 4 (four) hours as needed for severe pain. 20 tablet 0  . paliperidone (INVEGA SUSTENNA) 156 MG/ML SUSP injection Inject 1 mL (156 mg total)  into the muscle once. 0.9 mL 0  . paliperidone (INVEGA) 9 MG 24 hr tablet Take 1 tablet (9 mg total) by mouth at bedtime. 7 tablet 0  . sulfamethoxazole-trimethoprim (BACTRIM DS,SEPTRA DS) 800-160 MG tablet Take 1 tablet by mouth 2 (two) times daily. 20 tablet 0  . zolpidem (AMBIEN) 10 MG tablet Take 1 tablet (10 mg total) by mouth at bedtime. 15 tablet 0    Musculoskeletal: Strength & Muscle Tone: within normal limits Gait & Station: normal Patient leans: N/A  Psychiatric Specialty Exam: Physical Exam  Nursing note and vitals reviewed. Constitutional: He appears well-developed and well-nourished.  HENT:  Head: Normocephalic and atraumatic.  Eyes: Conjunctivae are normal. Pupils are equal, round, and reactive to light.  Neck: Normal range of motion.  Cardiovascular: Regular rhythm and normal heart sounds.   Respiratory: Effort normal. No respiratory distress.  GI: Soft.  Musculoskeletal: Normal range of motion.  Neurological: He is alert.  Skin: Skin is warm and dry.  Psychiatric: Judgment normal. His affect is blunt. His speech is delayed. He is slowed. Thought content is not paranoid. Cognition and memory are normal. He expresses no homicidal and no suicidal ideation.    Review of Systems  Constitutional: Negative.   HENT: Negative.   Eyes: Negative.   Respiratory: Negative.   Cardiovascular: Negative.   Gastrointestinal: Negative.   Musculoskeletal: Negative.   Skin:  Negative.   Neurological: Negative.   Psychiatric/Behavioral: Negative for depression, hallucinations, memory loss, substance abuse and suicidal ideas. The patient is not nervous/anxious and does not have insomnia.     Blood pressure (!) 116/49, pulse 100, temperature 98.5 F (36.9 C), temperature source Oral, resp. rate 20, height 5' 9"  (1.753 m), weight 90.7 kg (200 lb), SpO2 97 %.Body mass index is 29.53 kg/m.  General Appearance: Fairly Groomed  Eye Contact:  Minimal  Speech:  Slow  Volume:  Decreased   Mood:  Anxious  Affect:  Blunt  Thought Process:  Goal Directed  Orientation:  Full (Time, Place, and Person)  Thought Content:  Logical  Suicidal Thoughts:  No  Homicidal Thoughts:  No  Memory:  Immediate;   Good Recent;   Fair Remote;   Fair  Judgement:  Fair  Insight:  Fair  Psychomotor Activity:  Normal  Concentration:  Concentration: Fair  Recall:  [employer] of Knowledge:  Fair  Language:  Fair  Akathisia:  No  Handed:  Right  AIMS (if indicated):     Assets:  Communication Skills Desire for Improvement Physical Health Social Support  ADL's:  Intact  Cognition:  WNL  Sleep:        Treatment Plan Summary: Medication management and Plan 25 year old man with past history of schizophrenia. Recent impulsivity. Family wants him back on medication. Patient does not meet criteria for inpatient hospitalization or commitment. He agrees to take an injection of Abilify long-acting 400 mg and a prescription for Abilify 15 mg oral a day and will be referred back to [facility]. Case reviewed with emergency room doctor. No need for commitment.  Disposition: Patient does not meet criteria for psychiatric inpatient admission. Supportive therapy provided about ongoing stressors.  Alethia Berthold, MD 10/06/2016 5:38 PM

## 2016-10-06 NOTE — ED Provider Notes (Signed)
[facility] Emergency Department Provider Note  ____________________________________________   I have reviewed the triage vital signs and the nursing notes.   HISTORY  Chief Complaint Medical Clearance    HPI Ryan Colon is a 25 y.o. male with a history of violent behavior and remote history of psychosis presents today after being discharged from prison. He has no thought of hurting himself or anyone else. However he states that his anger was better managed when he was on Abilify. He was wondering if we can start him on back on Abilify. He states he has been eating and drinking very much recently but he denies any ingestion or suicidal thoughts. He has hope for the future.He is not hearing voices at this time.     Past Medical History:  Diagnosis Date  . Insomnia   . Schizophrenia [facility](HCC)     Patient Active Problem List   Diagnosis Date Noted  . 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 Cannabis use disorder, severe, dependence ([facility]) 04/02/2015  . Tobacco use disorder 04/02/2015    History reviewed. No pertinent surgical history.  Prior to Admission medications   Medication Sig Start Date End Date Taking? Authorizing Provider  ARIPiprazole (ABILIFY) 9.75 MG/1.3ML injection Inject into the muscle every 30 (thirty) days.    Historical Provider, MD  ibuprofen (ADVIL,MOTRIN) 800 MG tablet Take 1 tablet (800 mg total) by mouth every 8 (eight) hours as needed. 12/18/15   Joni Reiningonald K Smith, PA-C  naproxen (NAPROSYN) 500 MG tablet Take 1 tablet (500 mg total) by mouth 2 (two) times daily with a meal. 07/18/16   Jami L Hagler, PA-C  ondansetron (ZOFRAN ODT) 4 MG disintegrating tablet Take 1 tablet (4 mg total) by mouth every 8 (eight) hours as needed for nausea or vomiting. 05/29/15   Rebecka ApleyAllison P Webster, MD  oxyCODONE-acetaminophen (PERCOCET) 7.5-325 MG tablet Take 1 tablet by mouth every 4 (four) hours as needed for severe pain. 12/18/15   Joni Reiningonald K Smith,  PA-C  paliperidone (INVEGA SUSTENNA) 156 MG/ML SUSP injection Inject 1 mL (156 mg total) into the muscle once. 05/10/15   Jimmy FootmanAndrea Hernandez-Gonzalez, MD  paliperidone (INVEGA) 9 MG 24 hr tablet Take 1 tablet (9 mg total) by mouth at bedtime. 04/17/15   Jimmy FootmanAndrea Hernandez-Gonzalez, MD  sulfamethoxazole-trimethoprim (BACTRIM DS,SEPTRA DS) 800-160 MG tablet Take 1 tablet by mouth 2 (two) times daily. 12/18/15   Joni Reiningonald K Smith, PA-C  zolpidem (AMBIEN) 10 MG tablet Take 1 tablet (10 mg total) by mouth at bedtime. 04/17/15   Jimmy FootmanAndrea Hernandez-Gonzalez, MD    Allergies Abilify [aripiprazole] and Invega [paliperidone]  No family history on file.  Social History Social History  Substance Use Topics  . Smoking status: Former Smoker    Packs/day: 0.50    Years: 4.00    Types: Cigarettes  . Smokeless tobacco: Never Used  . Alcohol use Yes    Review of Systems Constitutional: No fever/chills Eyes: No visual changes. ENT: No sore throat. No stiff neck no neck pain Cardiovascular: Denies chest pain. Respiratory: Denies shortness of breath. Gastrointestinal:   no vomiting.  No diarrhea.  No constipation. Genitourinary: Negative for dysuria. Musculoskeletal: Negative lower extremity swelling Skin: Negative for rash. Neurological: Negative for severe headaches, focal weakness or numbness. 10-point ROS otherwise negative.  ____________________________________________   PHYSICAL EXAM:  VITAL SIGNS: ED Triage Vitals  Enc Vitals Group     BP 10/06/16 1416 (!) 116/49     Pulse Rate 10/06/16 1416 100     Resp  10/06/16 1416 20     Temp 10/06/16 1416 98.5 F (36.9 C)     Temp Source 10/06/16 1416 Oral     SpO2 10/06/16 1416 97 %     Weight 10/06/16 1417 200 lb (90.7 kg)     Height 10/06/16 1417 5\' 9"  (1.753 m)     Head Circumference --      Peak Flow --      Pain Score --      Pain Loc --      Pain Edu? --      Excl. in GC? --     Constitutional: Alert and oriented. Well appearing and in  no acute distress. Eyes: Conjunctivae are normal. PERRL. EOMI. Head: Atraumatic. Nose: No congestion/rhinnorhea. Mouth/Throat: Mucous membranes are moist.  Oropharynx non-erythematous. Neck: No stridor.   Nontender with no meningismus Cardiovascular: Normal rate, regular rhythm. Grossly normal heart sounds.  Good peripheral circulation. Respiratory: Normal respiratory effort.  No retractions. Lungs CTAB. Abdominal: Soft and nontender. No distention. No guarding no rebound Back:  There is no focal tenderness or step off.  there is no midline tenderness there are no lesions noted. there is no CVA tenderness Musculoskeletal: No lower extremity tenderness, no upper extremity tenderness. No joint effusions, no DVT signs strong distal pulses no edema Neurologic:  Normal speech and language. No gross focal neurologic deficits are appreciated.  Skin:  Skin is warm, dry and intact. No rash noted. Psychiatric: Mood and affect are normal. Speech and behavior are normal.  ____________________________________________   LABS (all labs ordered are listed, but only abnormal results are displayed)  Labs Reviewed  COMPREHENSIVE METABOLIC PANEL - Abnormal; Notable for the following:       Result Value   BUN 21 (*)    Creatinine, Ser 1.42 (*)    AST 57 (*)    All other components within normal limits  ETHANOL  CBC  URINE DRUG SCREEN, QUALITATIVE ([facility] ONLY)   ____________________________________________  EKG  I personally interpreted any EKGs ordered by me or triage  ____________________________________________  RADIOLOGY  I reviewed any imaging ordered by me or triage that were performed during my shift and, if possible, patient and/or family made aware of any abnormal findings. ____________________________________________   PROCEDURES  Procedure(s) performed: None  Procedures  Critical Care performed: None  ____________________________________________   INITIAL IMPRESSION /  ASSESSMENT AND PLAN / ED COURSE  Pertinent labs & imaging results that were available during my care of the patient were reviewed by me and considered in my medical decision making (see chart for details).  And seen and evaluated by Dr. Toni Amendlapacs of psychiatry. He agrees patient is a danger to self or others. He will discharge the patient. Patient will go live with his mother. Patient will be given Abilify by Dr. Toni Amendlapacs.  There is no SI or HI a new psychiatry either, we'll discharge the patient denies that his creatinine is slightly up. He states he has not been eating and drinking very well. We'll give him a liter of fluid and I have encouraged recheck of that obviously 1.4 is not dangerous but we'll advise close outpatient follow-up for recheck.  Clinical Course    ____________________________________________   FINAL CLINICAL IMPRESSION(S) / ED DIAGNOSES  Final diagnoses:  None      This chart was dictated using voice recognition software.  Despite best efforts to proofread,  errors can occur which can change meaning.      Jeanmarie PlantJames A Eliyohu Class, MD 10/06/16 [phone]

## 2018-08-17 IMAGING — US US SCROTUM
1 series · 14 of 25 positions shown · non-contrast
Comparison: None.

CLINICAL DATA: Left testicular pain and mass for 2 days

EXAM:
SCROTAL ULTRASOUND
DOPPLER ULTRASOUND OF THE TESTICLES
TECHNIQUE: Complete ultrasound examination of the testicles, epididymis, and
other scrotal structures was performed. Color and spectral Doppler
ultrasound were also utilized to evaluate blood flow to the
testicles.

[Series 1: us scrotum · 0.07mm/px · 14 of 73 slices shown]
[im 1/73]
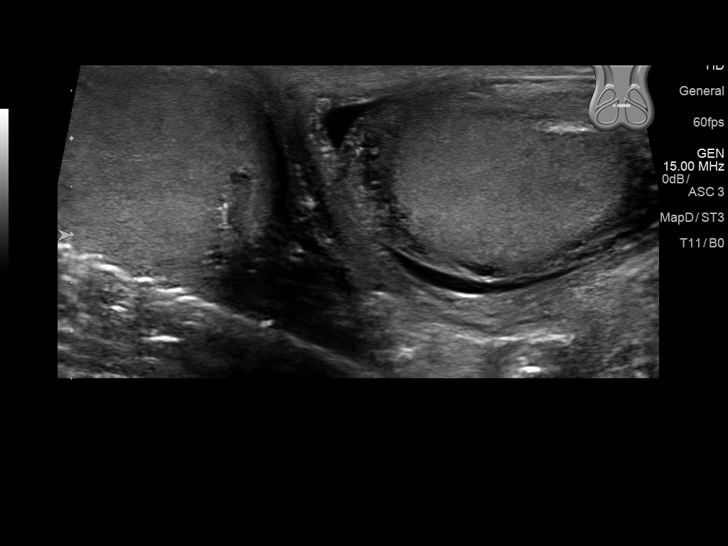
[im 7/73]
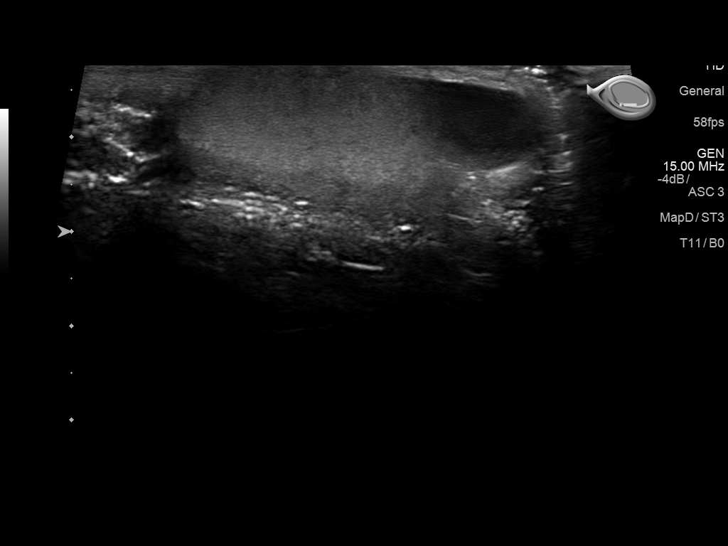
[im 13/73]
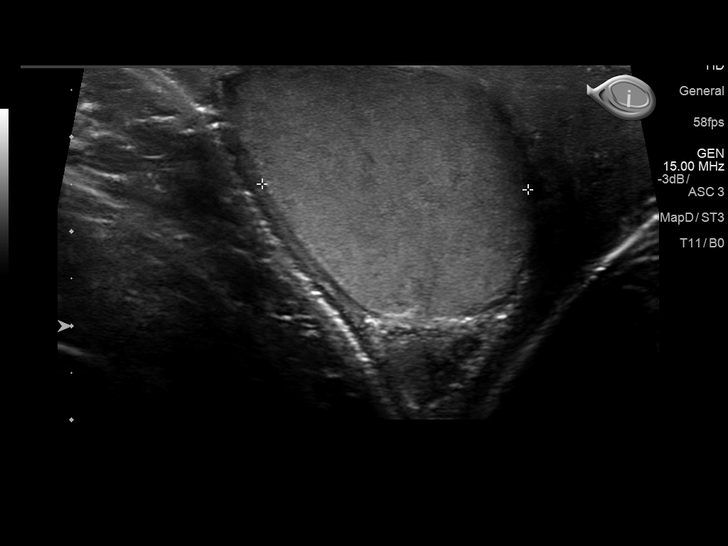
[im 19/73]
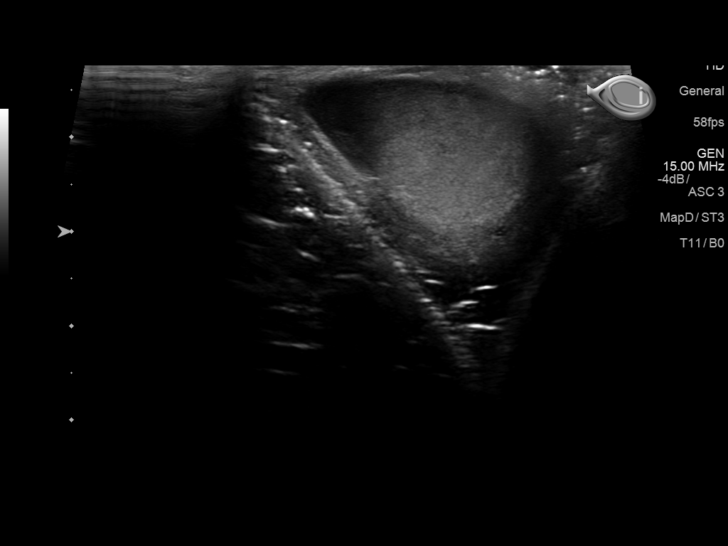
[im 25/73]
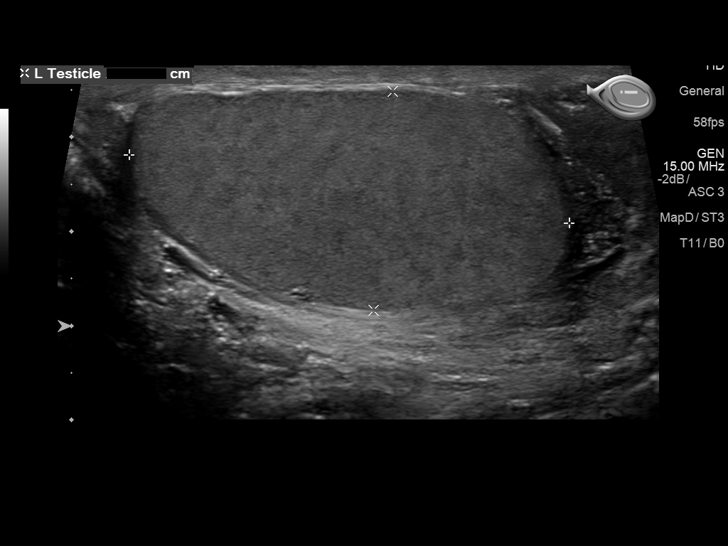
[im 28/73]
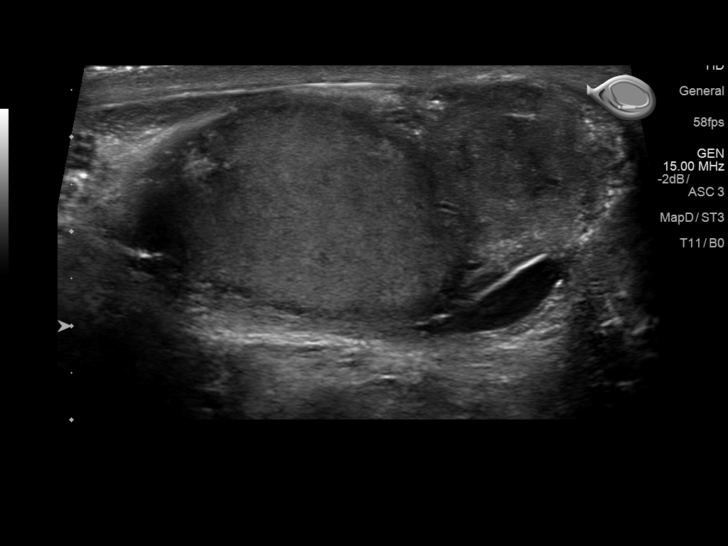
[im 34/73]
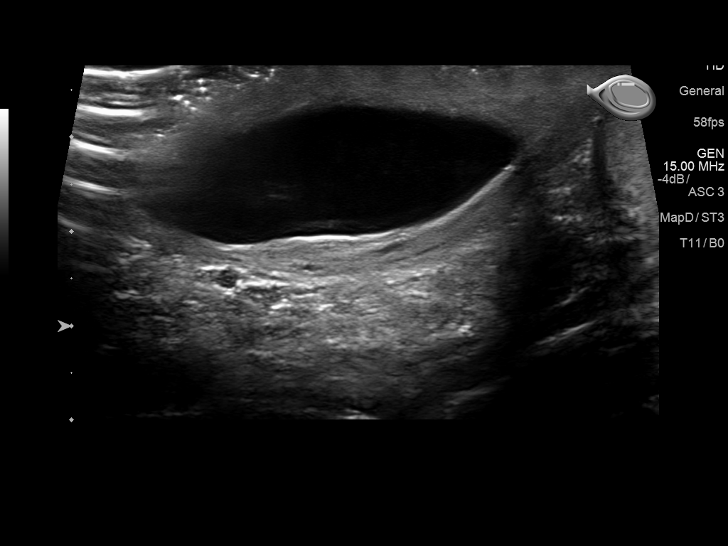
[im 40/73]
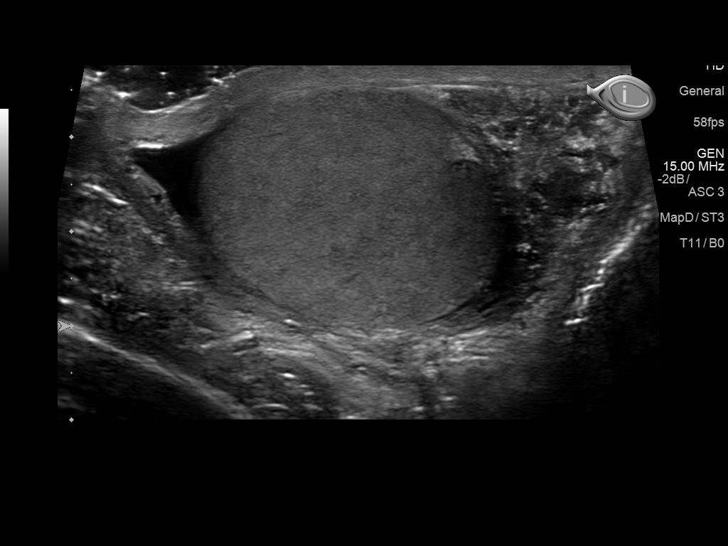
[im 46/73]
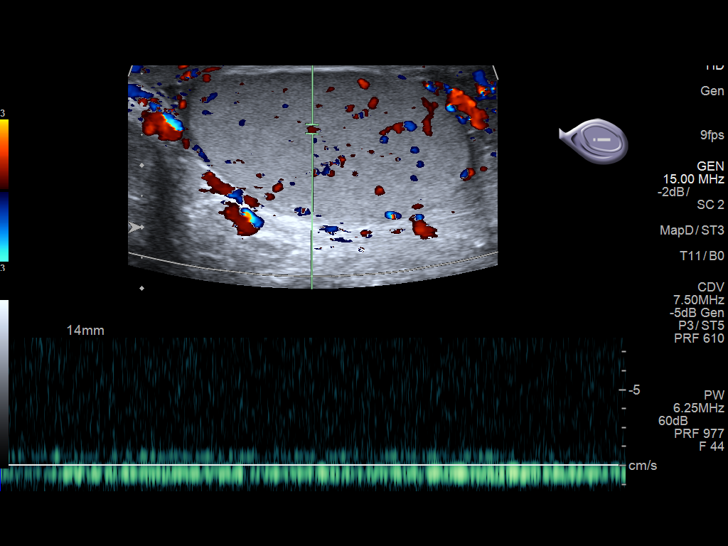
[im 49/73]
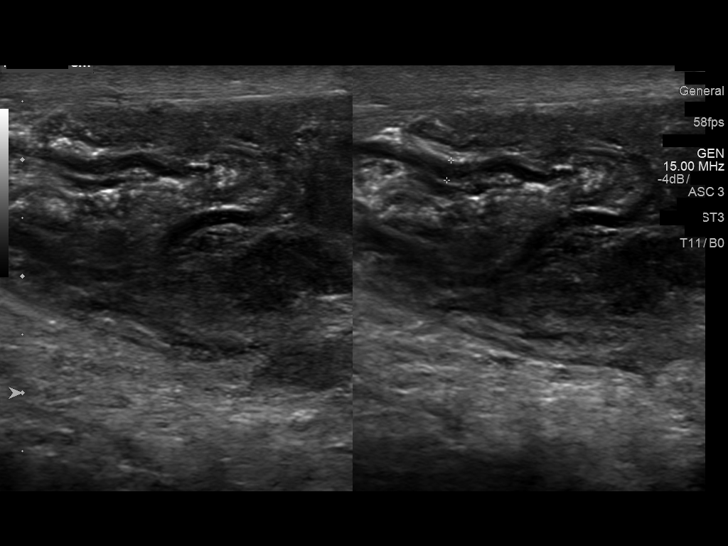
[im 55/73]
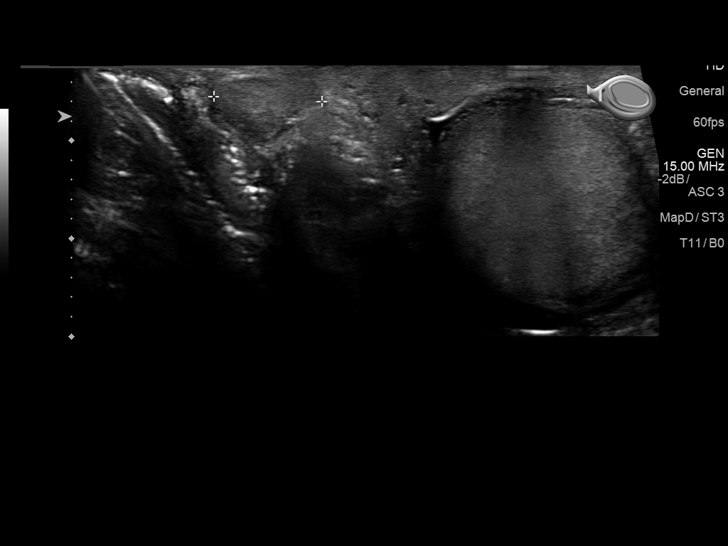
[im 61/73]
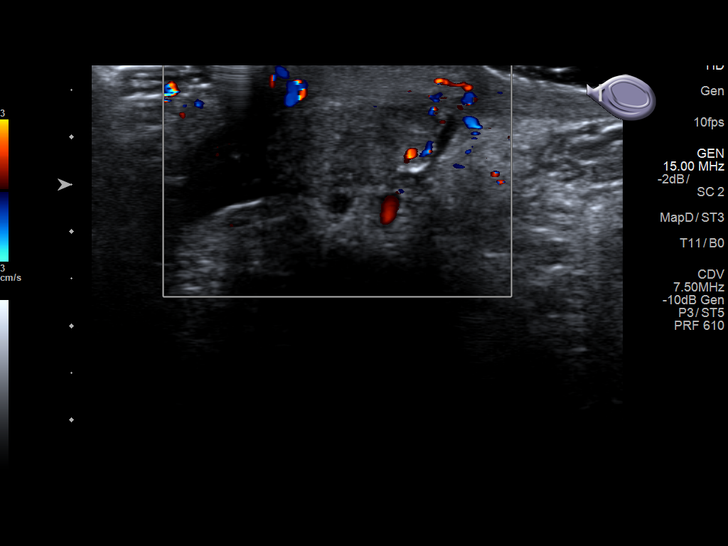
[im 67/73]
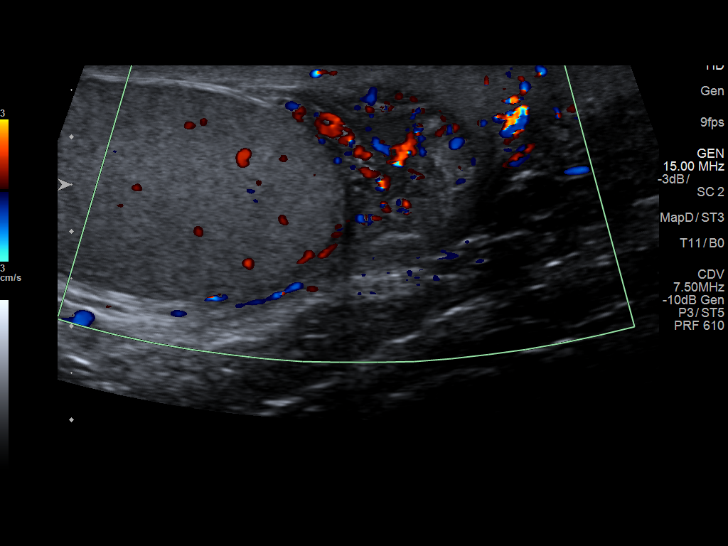
[im 73/73]
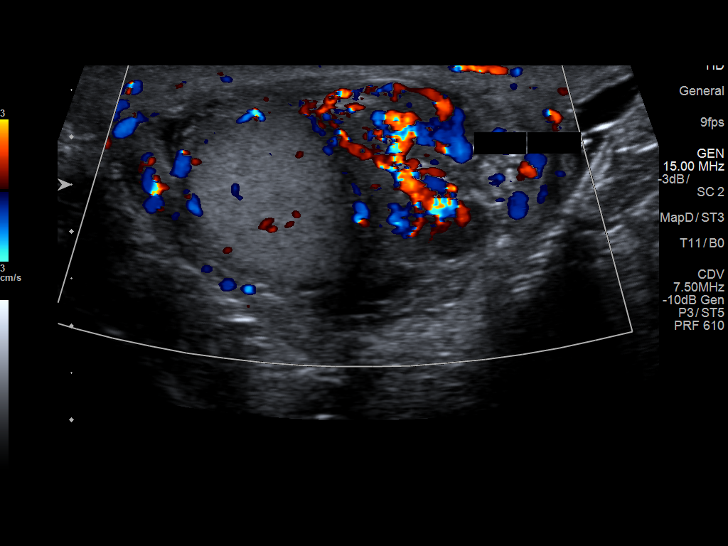

[14 of 25 positions shown; findings below may reference images not displayed]

FINDINGS: Right testicle

Measurements: 4.9 x 2.4 x 2.8 cm. No mass or microlithiasis
visualized.

Left testicle

Measurements: 4.7 x 2.3 x 3.5 cm. No mass or microlithiasis
visualized.

Right epididymis:  Normal in size and appearance.

Left epididymis: The left epididymal tail is enlarged with increased
vascular flow demonstrated.

Hydrocele:  Small left hydrocele.

Varicocele:  None visualized.

Pulsed Doppler interrogation of both testes demonstrates normal low
resistance arterial and venous waveforms bilaterally.
IMPRESSION: Enlarged, hyperemic left epididymal tail, consistent with
epididymitis. No testicular mass. Small left hydrocele.
